# Patient Record
Sex: Female | Born: 1995 | Race: Black or African American | Hispanic: No | Marital: Single | Smoking: Never smoker
Health system: Southern US, Community
[De-identification: ages and names within clinical notes are randomized; demographics above are authoritative.]

## PROBLEM LIST (undated history)

## (undated) DIAGNOSIS — R5600 Simple febrile convulsions: Secondary | ICD-10-CM

## (undated) DIAGNOSIS — G35 Multiple sclerosis: Secondary | ICD-10-CM

## (undated) HISTORY — PX: LIPOSUCTION: SHX10

## (undated) HISTORY — PX: MYRINGOTOMY: SUR874

---
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
## 2017-05-18 ENCOUNTER — Other Ambulatory Visit: Payer: Self-pay | Admitting: Obstetrics and Gynecology

## 2017-11-23 ENCOUNTER — Other Ambulatory Visit: Payer: Self-pay

## 2017-11-23 ENCOUNTER — Encounter (HOSPITAL_COMMUNITY): Payer: Self-pay | Admitting: *Deleted

## 2017-11-23 ENCOUNTER — Ambulatory Visit (HOSPITAL_COMMUNITY)
Admission: EM | Admit: 2017-11-23 | Discharge: 2017-11-23 | Disposition: A | Discharge status: Home / Self Care | Payer: BLUE CROSS/BLUE SHIELD | Attending: Family Medicine | Admitting: Family Medicine

## 2017-11-23 ENCOUNTER — Ambulatory Visit (INDEPENDENT_AMBULATORY_CARE_PROVIDER_SITE_OTHER): Payer: BLUE CROSS/BLUE SHIELD

## 2017-11-23 DIAGNOSIS — T07XXXA Unspecified multiple injuries, initial encounter: Secondary | ICD-10-CM | POA: Diagnosis not present

## 2017-11-23 DIAGNOSIS — S9032XA Contusion of left foot, initial encounter: Secondary | ICD-10-CM | POA: Diagnosis not present

## 2017-11-23 DIAGNOSIS — G35 Multiple sclerosis: Secondary | ICD-10-CM

## 2017-11-23 DIAGNOSIS — G35D Multiple sclerosis, unspecified: Secondary | ICD-10-CM

## 2017-11-23 MED ORDER — IBUPROFEN 800 MG PO TABS: 800.0000 mg | ORAL_TABLET | Freq: Three times a day (TID) | ORAL | 0 refills | Status: DC
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
## 2017-11-23 NOTE — Discharge Instructions (Signed)
Use ice or heat to painful areas Take ibuprofen 3 x a day with food Limit walking activity while foot is painful Expect improvement over the next several days
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
## 2019-01-18 ENCOUNTER — Ambulatory Visit: Payer: BC Managed Care – PPO | Attending: Internal Medicine

## 2019-01-18 DIAGNOSIS — Z20822 Contact with and (suspected) exposure to covid-19: Secondary | ICD-10-CM

## 2019-01-19 LAB — NOVEL CORONAVIRUS, NAA: SARS-CoV-2, NAA: NOT DETECTED

## 2019-04-22 ENCOUNTER — Ambulatory Visit: Payer: BC Managed Care – PPO | Attending: Internal Medicine

## 2019-04-22 DIAGNOSIS — Z20822 Contact with and (suspected) exposure to covid-19: Secondary | ICD-10-CM

## 2019-04-23 LAB — NOVEL CORONAVIRUS, NAA: SARS-CoV-2, NAA: NOT DETECTED

## 2019-04-23 LAB — SARS-COV-2, NAA 2 DAY TAT

## 2019-09-23 ENCOUNTER — Other Ambulatory Visit: Payer: BC Managed Care – PPO

## 2019-09-23 ENCOUNTER — Other Ambulatory Visit: Payer: Self-pay | Admitting: Sleep Medicine

## 2019-09-23 DIAGNOSIS — I471 Supraventricular tachycardia: Secondary | ICD-10-CM

## 2019-09-24 LAB — NOVEL CORONAVIRUS, NAA: SARS-CoV-2, NAA: NOT DETECTED

## 2020-09-02 ENCOUNTER — Emergency Department (HOSPITAL_BASED_OUTPATIENT_CLINIC_OR_DEPARTMENT_OTHER)
Admission: EM | Admit: 2020-09-02 | Discharge: 2020-09-02 | Disposition: A | Discharge status: Home / Self Care | Payer: BC Managed Care – PPO | Attending: Emergency Medicine | Admitting: Emergency Medicine

## 2020-09-02 ENCOUNTER — Encounter (HOSPITAL_BASED_OUTPATIENT_CLINIC_OR_DEPARTMENT_OTHER): Payer: Self-pay

## 2020-09-02 ENCOUNTER — Other Ambulatory Visit: Payer: Self-pay

## 2020-09-02 DIAGNOSIS — H66002 Acute suppurative otitis media without spontaneous rupture of ear drum, left ear: Secondary | ICD-10-CM | POA: Diagnosis not present

## 2020-09-02 DIAGNOSIS — H9202 Otalgia, left ear: Secondary | ICD-10-CM | POA: Diagnosis present

## 2020-09-02 MED ORDER — AMOXICILLIN 500 MG PO CAPS: 500.0000 mg | ORAL_CAPSULE | Freq: Three times a day (TID) | ORAL | 0 refills | Status: DC

## 2020-09-02 MED ORDER — AMOXICILLIN 500 MG PO CAPS
500.0000 mg | ORAL_CAPSULE | Freq: Once | ORAL | Status: AC
  Administered 2020-09-02: 500 mg via ORAL
  Filled 2020-09-02: qty 1

## 2020-09-02 NOTE — ED Provider Notes (Signed)
[facility] EMERGENCY DEPARTMENT Provider Note   CSN: [id number] Arrival date & time: 09/02/20  1512     History Chief Complaint  Patient presents with   Otalgia    Monica Tate is a 25 y.o. female presenting for valuation of left ear drainage and pain.  Patient states that the past 4 days she has had drainage and pain of her left ear.  This started after her ears were cleaned at her primary care doctor's office.  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  She denies fevers or chills.  No significant nasal congestion.  No pain on the right side.  She was not having any issues prior to the PCP appointment. She has not taken anything for her sxs.   HPI     Past Medical History:  Diagnosis Date   Febrile seizures (HCC)    MS (multiple sclerosis) (HCC)     Patient Active Problem List   Diagnosis Date Noted   MS (multiple sclerosis) (HCC) 11/23/2017   Optic neuritis, left 01/21/2012    Past Surgical History:  Procedure Laterality Date   MYRINGOTOMY       OB History   No obstetric history on file.     Family History  Problem Relation Age of Onset   Crohn's disease Mother    Hypertension Father    Lupus Father    Diabetes Maternal Grandmother    Hypertension Maternal Grandmother    Scleroderma Maternal Grandmother    Hypertension Maternal Grandfather    Diabetes Paternal Grandmother    Hypertension Paternal Grandmother    Heart disease Paternal Grandmother    Hypertension Paternal Grandfather    Heart disease Paternal Grandfather     Social History   Tobacco Use   Smoking status: Never   Smokeless tobacco: Never  Vaping Use   Vaping Use: Never used  Substance Use Topics   Alcohol use: Yes   Drug use: No    Home Medications Prior to Admission medications   Medication Sig Start Date End Date Taking? Authorizing Provider  amoxicillin (AMOXIL) 500 MG capsule Take 1 capsule (500 mg total) by mouth 3 (three)  times daily. 09/02/20  Yes Ankit Degregorio, PA-C  Fingolimod HCl (GILENYA) 0.5 MG CAPS Take 0.5 mg by mouth daily.    [provider]  ibuprofen (ADVIL,MOTRIN) 800 MG tablet Take 1 tablet (800 mg total) by mouth 3 (three) times daily. 11/23/17   Eustace Moore, MD    Allergies    Patient has no known allergies.  Review of Systems   Review of Systems  Constitutional:  Negative for fever.  HENT:  Positive for ear pain.    Physical Exam Updated Vital Signs BP 126/80 (BP Location: Right Arm)   Pulse 86   Temp 97.9 F (36.6 C) (Oral)   Resp 18   Ht 5\' 7"  (1.702 m)   Wt 112.9 kg   LMP 09/02/2020   SpO2 100%   BMI 39.00 kg/m   Physical Exam Vitals and nursing note reviewed.  Constitutional:      General: She is not in acute distress.    Appearance: She is well-developed.  HENT:     Head: Normocephalic and atraumatic.     Ears:     Comments: L TM not completely visualized due to drainage in the ear canal.  Mild purulence noted and the right lower part of the eardrum is erythematous. Right TM nonerythematous nonbulging  Eyes:     Extraocular Movements: Extraocular movements intact.  Cardiovascular:     Rate and Rhythm: Normal rate.  Pulmonary:     Effort: Pulmonary effort is normal.  Abdominal:     General: There is no distension.  Musculoskeletal:        General: Normal range of motion.     Cervical back: Normal range of motion.  Skin:    General: Skin is warm.     Findings: No rash.  Neurological:     Mental Status: She is alert and oriented to person, place, and time.     ED Results / Procedures / Treatments   Labs (all labs ordered are listed, but only abnormal results are displayed) Labs Reviewed - No data to display  EKG None  Radiology No results found.  Procedures Procedures   Medications Ordered in ED Medications  amoxicillin (AMOXIL) capsule 500 mg (500 mg Oral Given 09/02/20 1842)    ED Course  I have reviewed the triage vital  signs and the nursing notes.  Pertinent labs & imaging results that were available during my care of the patient were reviewed by me and considered in my medical decision making (see chart for details).    MDM Rules/Calculators/A&P                           Patient presenting for evaluation of left ear pain and drainage.  On exam, patient appears nontoxic.  TM is not completely visualized, but the part that is seen is erythematous.  There is some drainage/discharge.  As such, will treat for AOM.  At this time, patient appears safe for discharge.  Return precautions given.  Patient states she understands and agrees to plan  Final Clinical Impression(s) / ED Diagnoses Final diagnoses:  Acute suppurative otitis media of left ear without spontaneous rupture of tympanic membrane, recurrence not specified    Rx / DC Orders ED Discharge Orders          Ordered    amoxicillin (AMOXIL) 500 MG capsule  3 times daily        09/02/20 1807             Alveria Apley, PA-C 09/02/20 1845    Charlynne Pander, MD 09/02/20 1858

## 2020-09-02 NOTE — ED Triage Notes (Signed)
Pt arrives ambulatory to ED with c/o having water in ear drum , states that she has a hole in her ear drum and had her ears cleaned during a physical states pain and drainage since.

## 2020-09-02 NOTE — ED Notes (Signed)
Pt discharged to home. Discharge instructions have been discussed with patient and/or family members. Pt verbally acknowledges understanding d/c instructions, and endorses comprehension to checkout at registration before leaving.  °

## 2020-09-02 NOTE — Discharge Instructions (Addendum)
Take antibiotics as prescribed.  Take entire course, even if symptoms improve. Use Tylenol ibuprofen as needed for pain. You may want to try Flonase to help with nasal congestion and drainage. Follow-up with your primary care doctor if symptoms are not improving. Return to the emergency room with any new, worsening, concerning symptoms

## 2021-01-20 NOTE — L&D Delivery Note (Signed)
Delivery Note    Patient Name: Monica Tate DOB: 03-04-1995 MRN: [id number]  Date of admission: 01/13/2022 Delivering MD: Noralyn Pick  Date of delivery: 01/14/2022 Type of delivery: SVD  Newborn Data: Live born female  Birth Weight:   APGAR: 79, 79  Newborn Delivery   Birth date/time: 01/14/2022 01:04:33 Delivery type: Vaginal, Spontaneous      Monica Tate, 26 y.o., @ [redacted]w[redacted]d  G1P0, who was admitted for admitted on 12/25 for spontaneous latent labor progressed with AROM , clear, gbs-, rh- (O-). I was called to the room when she progressed 2+ station in the second stage of labor.  She pushed for 15/min.  She delivered a viable infant, cephalic and restituted to the LOA position over an intact perineum.  A nuchal cord   was not identified. The baby was placed on maternal abdomen while initial step of NRP were perfmored (Dry, Stimulated, and warmed). Hat placed on baby for thermoregulation. Delayed cord clamping was performed for 8 minutes.  Cord double clamped and cut.  Cord cut by FOB. Apgar scores were 9 and 9. Prophylactic Pitocin was started in the third stage of labor for active management. The placenta delivered spontaneously, shultz, with a 3 vessel cord and was left in the room for pt pt but if pt does not get ice bucket will sent to LD.  Inspection revealed right and left shallow labial tear/abrasions, no repair indicated. An examination of the vaginal vault and cervix was free from lacerations. The uterus was firm, bleeding stable.  Placenta and umbilical artery blood gas were not sent.  There were no complications during the procedure, mother desires to take placenta home, pt informed she may do that but will need a ice bucket to keep it on to travel home with, pt desires to encapsulated the placenta, pt informed that I was under the impression she would need to have that kit or organized prior to delivery for safe and fast encapsulation, but pt will klook into this.  Mom and  baby skin to skin following delivery. Left in stable condition.  Maternal Info: Anesthesia: Epidural Episiotomy: no Lacerations:  shallow right and left labials Suture Repair: no Est. Blood Loss (mL):  139  Newborn Info:  Baby Sex: female Babies Name: [facility] APGAR (1 MIN): 9   APGAR (5 MINS): 9   APGAR (10 MINS):     Mom to postpartum.  Baby to Couplet care / Skin to Skin.  Delivery Report:    Review the Delivery Report for details.   JPlankinton FNP-C, PMHNP-BC  [id number] [id number] Cell: [phone] Office Phone: [phone]: [phone]/26/2023  1:33 AM

## 2021-05-13 ENCOUNTER — Emergency Department (HOSPITAL_BASED_OUTPATIENT_CLINIC_OR_DEPARTMENT_OTHER)
Admission: EM | Admit: 2021-05-13 | Discharge: 2021-05-14 | Disposition: A | Discharge status: Home / Self Care | Payer: BC Managed Care – PPO | Attending: Emergency Medicine | Admitting: Emergency Medicine

## 2021-05-13 ENCOUNTER — Emergency Department (HOSPITAL_BASED_OUTPATIENT_CLINIC_OR_DEPARTMENT_OTHER): Payer: BC Managed Care – PPO

## 2021-05-13 ENCOUNTER — Encounter (HOSPITAL_BASED_OUTPATIENT_CLINIC_OR_DEPARTMENT_OTHER): Payer: Self-pay | Admitting: Emergency Medicine

## 2021-05-13 ENCOUNTER — Other Ambulatory Visit: Payer: Self-pay

## 2021-05-13 DIAGNOSIS — R791 Abnormal coagulation profile: Secondary | ICD-10-CM | POA: Diagnosis not present

## 2021-05-13 DIAGNOSIS — R600 Localized edema: Secondary | ICD-10-CM | POA: Insufficient documentation

## 2021-05-13 DIAGNOSIS — M7989 Other specified soft tissue disorders: Secondary | ICD-10-CM | POA: Diagnosis present

## 2021-05-13 LAB — COMPREHENSIVE METABOLIC PANEL
ALT: 41 U/L (ref 0–44)
AST: 31 U/L (ref 15–41)
Albumin: 4.2 g/dL (ref 3.5–5.0)
Alkaline Phosphatase: 67 U/L (ref 38–126)
Anion gap: 7 (ref 5–15)
BUN: 6 mg/dL (ref 6–20)
CO2: 23 mmol/L (ref 22–32)
Calcium: 8.8 mg/dL — ABNORMAL LOW (ref 8.9–10.3)
Chloride: 108 mmol/L (ref 98–111)
Creatinine, Ser: 0.79 mg/dL (ref 0.44–1.00)
GFR, Estimated: >60 mL/min (ref >60)
Glucose, Bld: 92 mg/dL (ref 70–99)
Potassium: 3.5 mmol/L (ref 3.5–5.1)
Sodium: 138 mmol/L (ref 135–145)
Total Bilirubin: 0.1 mg/dL — ABNORMAL LOW (ref 0.3–1.2)
Total Protein: 7.9 g/dL (ref 6.5–8.1)

## 2021-05-13 LAB — CBC WITH DIFFERENTIAL/PLATELET
Abs Immature Granulocytes: 0.01 10*3/uL (ref 0.00–0.07)
Basophils Absolute: 0 10*3/uL (ref 0.0–0.1)
Basophils Relative: 0 %
Eosinophils Absolute: 0 10*3/uL (ref 0.0–0.5)
Eosinophils Relative: 1 %
HCT: 37.6 % (ref 36.0–46.0)
Hemoglobin: 12.5 g/dL (ref 12.0–15.0)
Immature Granulocytes: 0 %
Lymphocytes Relative: 13 %
Lymphs Abs: 0.6 10*3/uL — ABNORMAL LOW (ref 0.7–4.0)
MCH: 29.3 pg (ref 26.0–34.0)
MCHC: 33.2 g/dL (ref 30.0–36.0)
MCV: 88.1 fL (ref 80.0–100.0)
Monocytes Absolute: 0.7 10*3/uL (ref 0.1–1.0)
Monocytes Relative: 15 %
Neutro Abs: 3.2 10*3/uL (ref 1.7–7.7)
Neutrophils Relative %: 71 %
Platelets: 299 10*3/uL (ref 150–400)
RBC: 4.27 MIL/uL (ref 3.87–5.11)
RDW: 13.1 % (ref 11.5–15.5)
WBC: 4.4 10*3/uL (ref 4.0–10.5)
nRBC: 0 % (ref 0.0–0.2)

## 2021-05-13 LAB — D-DIMER, QUANTITATIVE: D-Dimer, Quant: 1.43 ug/mL-FEU — ABNORMAL HIGH (ref 0.00–0.50)

## 2021-05-13 NOTE — ED Triage Notes (Signed)
Pt c/o lower BL leg pain and swelling x 1 month. 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 Pt is on birth control.  ?

## 2021-05-13 NOTE — ED Provider Notes (Signed)
?[facility] EMERGENCY DEPARTMENT ?Provider Note ? ? ?CSN: [id number] ?Arrival date & time: 05/13/21  2150 ? ?  ? ?History ? ?Chief Complaint  ?Patient presents with  ? Leg Pain  ? Leg Swelling  ? ? ?Monica Tate is a 26 y.o. female. ? ?26 y.o female with a PMH of MS presents to the ED with a chief complaint of bilateral leg swelling x a few months. This is a recurrent complaint, she reports noticing this after surgery in September and had a negative DVT study but did not follow up with anyone about the swelling. This has gotten worse with pain and also swelling.  Patient does report worsening leg pain, feels that this is exacerbated after a workday.  Patient is currently on birth control, has a diva cup and prior to this had the next plan on.  Does not follow-up with her PCP, however does report compliance with her medication for MS.  Not have any prior history of blood clots, no shortness of breath, no chest pain.  ? ?The history is provided by the patient.  ?Leg Pain ?Location:  Leg ?Time since incident:  3 months ?Injury: no   ?Associated symptoms: no back pain and no fever   ? ?  ? ?Home Medications ?Prior to Admission medications   ?Medication Sig Start Date End Date Taking? Authorizing Provider  ?cephALEXin (KEFLEX) 500 MG capsule Take 1 capsule (500 mg total) by mouth 3 (three) times daily. 05/19/21   Achille Rich, PA-C  ?Fingolimod HCl (GILENYA) 0.5 MG CAPS Take 0.5 mg by mouth daily.    [provider]  ?ibuprofen (ADVIL,MOTRIN) 800 MG tablet Take 1 tablet (800 mg total) by mouth 3 (three) times daily. 11/23/17   Eustace Moore, MD  ?   ? ?Allergies    ?Patient has no known allergies.   ? ?Review of Systems   ?Review of Systems  ?Constitutional:  Negative for chills and fever.  ?Respiratory:  Negative for shortness of breath.   ?Cardiovascular:  Positive for leg swelling (bilteral). Negative for chest pain.  ?Gastrointestinal:  Negative for abdominal pain, nausea and vomiting.   ?Genitourinary:  Negative for flank pain.  ?Musculoskeletal:  Negative for back pain.  ?All other systems reviewed and are negative. ? ?Physical Exam ?Updated Vital Signs ?BP 128/72 (BP Location: Right Arm)   Pulse 88   Temp 97.9 ?F (36.6 ?C) (Oral)   Resp 18   Ht 5\' 7"  (1.702 m)   Wt 113.4 kg   LMP 04/12/2021 (Approximate)   SpO2 100%   BMI 39.16 kg/m?  ?Physical Exam ?Vitals and nursing note reviewed.  ?Constitutional:   ?   Appearance: Normal appearance.  ?HENT:  ?   Head: Normocephalic and atraumatic.  ?   Mouth/Throat:  ?   Mouth: Mucous membranes are moist.  ?Cardiovascular:  ?   Pulses:     ?     Dorsalis pedis pulses are 2+ on the right side and 2+ on the left side.  ?   Comments: No bilateral pitting edema, no calf tenderness bilaterally.  DP, PT pulses present. ?Pulmonary:  ?   Effort: Pulmonary effort is normal.  ?   Breath sounds: No wheezing.  ?Abdominal:  ?   General: Abdomen is flat.  ?   Tenderness: There is no abdominal tenderness.  ?Musculoskeletal:     ?   General: Swelling present.  ?   Cervical back: Normal range of motion and neck supple.  ?Skin: ?  General: Skin is warm and dry.  ?Neurological:  ?   Mental Status: She is alert and oriented to person, place, and time.  ? ? ?ED Results / Procedures / Treatments   ?Labs ?(all labs ordered are listed, but only abnormal results are displayed) ?Labs Reviewed  ?CBC WITH DIFFERENTIAL/PLATELET - Abnormal; Notable for the following components:  ?    Result Value  ? Lymphs Abs 0.6 (*)   ? All other components within normal limits  ?COMPREHENSIVE METABOLIC PANEL - Abnormal; Notable for the following components:  ? Calcium 8.8 (*)   ? Total Bilirubin 0.1 (*)   ? All other components within normal limits  ?D-DIMER, QUANTITATIVE - Abnormal; Notable for the following components:  ? D-Dimer, Quant 1.43 (*)   ? All other components within normal limits  ? ? ?EKG ?None ? ?Radiology ?US OB LESS THAN 14 WEEKS WITH OB TRANSVAGINAL ? ?Result Date:  05/19/2021 ?CLINICAL DATA:  Pelvic pain. EXAM: OBSTETRIC <14 WK [place] AND TRANSVAGINAL OB US TECHNIQUE: Both transabdominal and transvaginal ultrasound examinations were performed for complete evaluation of the gestation as well as the maternal uterus, adnexal regions, and pelvic cul-de-sac. Transvaginal technique was performed to assess early pregnancy. COMPARISON:  None. FINDINGS: Intrauterine gestational sac: Single Yolk sac:  Visualized. Embryo:  Visualized. Cardiac Activity: Not Visualized. CRL:  2 mm   5 w   5 d                  [place] EDC: 01/14/2022 Subchorionic hemorrhage:  None visualized. Maternal uterus/adnexae: Corpus luteum noted in the right ovary. Left ovary is within normal limits. Trace free fluid in the pelvis. IMPRESSION: 1. Single intrauterine gestation identified. No cardiac activity identified at this time which may still be within normal limits for a gestation this age. Recommend short-term follow-up ultrasound confirm viability. 2. Likely corpus luteum in the right ovary. 3. Trace free fluid in the pelvis. Electronically Signed   By: Darliss Cheney M.D.   On: 05/19/2021 19:55   ? ?Procedures ?Procedures  ? ? ?Medications Ordered in ED ?Medications - No data to display ? ?ED Course/ Medical Decision Making/ A&P ?  ?                        ?Medical Decision Making ?Amount and/or Complexity of Data Reviewed ?Labs: ordered. ? ? ?This patient presents to the ED for concern of leg swelling BL, this involves a number of treatment options, and is a complaint that carries with it a high risk of complications and morbidity.  The differential diagnosis includes DVT, cellulitis, CHF versus peripheral edema ? ? ?Co morbidities: ?Discussed in HPI ? ? ?Brief History: ? ?Patient with a prior history of MS currently on medication presents to the ED with bilateral leg swelling for the past several months.  Reports worsening pain, worsening swelling to her bilateral legs, no trauma, no weakness, no fevers, no prior  history of blood clots.  She is on OCPs, with a diva cup in place, primarily she had a Nexplanon but no systemic OCPs. ? ?EMR reviewed including pt PMHx, past surgical history and past visits to ER.  ? ?See HPI for more details ? ? ?Lab Tests: ? ?I ordered and independently interpreted labs.  The pertinent results include:   ? ?I personally reviewed all laboratory work and imaging. Metabolic panel without any acute abnormality specifically kidney function within normal limits and no significant electrolyte abnormalities. CBC without leukocytosis  or significant anemia. Dimer level obtained which was positive.  ? ? ?Imaging Studies: ? ?US DVT pending.  ? ?Social Determinants of Health: ? ?The patient's social determinants of health were a factor in the care of this patient ? ? ? ?Problem List / ED Course: ? ?Patient presented for positive D-dimer level, requesting DVT study due to ongoing bilateral leg pain.  No chest pain, no shortness of breath.  Patient signed out to incoming team pending ultrasound DVT study. ? ? ?Dispostion: ? ?Pending DVT study, stable for further management.  ? ? ? ?Portions of this note were generated with Scientist, clinical (histocompatibility and immunogenetics). Dictation errors may occur despite best attempts at proofreading.   ?Final Clinical Impression(s) / ED Diagnoses ?Final diagnoses:  ?Bilateral lower extremity edema  ? ? ?Rx / DC Orders ?ED Discharge Orders   ? ? None  ? ?  ? ? ?  ?Claude Manges, PA-C ?05/21/21 1140 ? ?  ?Melene Plan, DO ?05/21/21 1501 ? ?

## 2021-05-13 NOTE — ED Notes (Signed)
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

## 2021-05-14 NOTE — ED Provider Notes (Signed)
I assumed care of this patient.  Please see previous provider note for further details of Hx, PE.  Briefly patient is a 26 y.o. female who presented BLE swelling. Dimer +, pending [place]. ? ?[place] negative for DVT. ?Recommended compression stockings, elevation, and exercise. ? ?PCP follow up. ? ?The patient appears reasonably screened and/or stabilized for discharge and I doubt any other medical condition or other [facility] requiring further screening, evaluation, or treatment in the ED at this time prior to discharge. Safe for discharge with strict return precautions. ? ?Disposition: Discharge ? ?Condition: Good ? ?I have discussed the results, Dx and Tx plan with the patient/family who expressed understanding and agree(s) with the plan. Discharge instructions discussed at length. The patient/family was given strict return precautions who verbalized understanding of the instructions. No further questions at time of discharge.  ? ? ?ED Discharge Orders   ? ? None  ? ?  ? ? ? ?Follow Up: ?Virgilio Belling, PA-C ?[address] ?[place] [address] ?[phone] ? ?Call  ?to schedule an appointment for close follow up ? ? ?  ? ? ? ?  ?Nira Conn, MD ?05/14/21 0110 ? ?

## 2021-05-17 ENCOUNTER — Emergency Department (HOSPITAL_BASED_OUTPATIENT_CLINIC_OR_DEPARTMENT_OTHER): Payer: BC Managed Care – PPO

## 2021-05-17 ENCOUNTER — Other Ambulatory Visit: Payer: Self-pay

## 2021-05-17 ENCOUNTER — Encounter (HOSPITAL_BASED_OUTPATIENT_CLINIC_OR_DEPARTMENT_OTHER): Payer: Self-pay

## 2021-05-17 ENCOUNTER — Emergency Department (HOSPITAL_BASED_OUTPATIENT_CLINIC_OR_DEPARTMENT_OTHER)
Admission: EM | Admit: 2021-05-17 | Discharge: 2021-05-17 | Disposition: A | Discharge status: Home / Self Care | Payer: BC Managed Care – PPO | Attending: Emergency Medicine | Admitting: Emergency Medicine

## 2021-05-17 DIAGNOSIS — R0602 Shortness of breath: Secondary | ICD-10-CM | POA: Insufficient documentation

## 2021-05-17 DIAGNOSIS — M7989 Other specified soft tissue disorders: Secondary | ICD-10-CM | POA: Insufficient documentation

## 2021-05-17 DIAGNOSIS — R7989 Other specified abnormal findings of blood chemistry: Secondary | ICD-10-CM | POA: Diagnosis not present

## 2021-05-17 LAB — BASIC METABOLIC PANEL
Anion gap: 6 (ref 5–15)
BUN: 10 mg/dL (ref 6–20)
CO2: 24 mmol/L (ref 22–32)
Calcium: 9 mg/dL (ref 8.9–10.3)
Chloride: 107 mmol/L (ref 98–111)
Creatinine, Ser: 0.91 mg/dL (ref 0.44–1.00)
GFR, Estimated: >60 mL/min (ref >60)
Glucose, Bld: 80 mg/dL (ref 70–99)
Potassium: 4.3 mmol/L (ref 3.5–5.1)
Sodium: 137 mmol/L (ref 135–145)

## 2021-05-17 MED ORDER — IOHEXOL 350 MG/ML SOLN
100.0000 mL | Freq: Once | INTRAVENOUS | Status: AC | PRN
Start: 2021-05-17 — End: 2021-05-17
  Administered 2021-05-17: 75 mL via INTRAVENOUS

## 2021-05-17 NOTE — ED Notes (Signed)
Attempted IV access x1. Obtained blood return in left AC, when flushing, patient c/o soreness at site and infiltration noted. Catheter removed. Boneta Lucks, RT at bedside to assist with bedside US to obtain IV access.  ?

## 2021-05-17 NOTE — ED Notes (Signed)
Patient states she has had intermittent swelling to bilateral lower legs x2 months. States this past week the swelling was persistent and she had pain in bilateral lower legs and sore on palpation. Patient was seen and had [place] of bilateral lower legs. Patient also reports intermittent shortness of breath. Patient was seen by her PCP on Thursday. Reports she was called today and told that her D-dimer remained high and she needed to go get a CT of her chest. Patient states she went to outpatient CT and they were unable to obtain IV access and she was told to go to an ER.  ?

## 2021-05-17 NOTE — Discharge Instructions (Addendum)
You were seen in the emergency department today for shortness of breath and leg swelling. ? ?As we discussed the scan of your chest showed no evidence of blood clots.  I am unsure as to why exactly your D-dimer level was elevated.  I recommend following up with your primary doctor next week to have this lab rechecked. ? ?There is not a significant amount of data about d-dimer levels and multiple sclerosis, but I think it would be worth it to follow up with your neurologist.  ? ?Continue to monitor how you're doing and return to the ER for new or worsening symptoms.  ?

## 2021-05-17 NOTE — ED Triage Notes (Signed)
Patient states her d-dimer was elevated and she was instructed by her provider to come to the ED for a CTA. Patient states shortness of breath is intermittent but has gotten progressively worse.  ?

## 2021-05-17 NOTE — ED Notes (Signed)
Boneta Lucks, RT remains at bedside to attempt IV access via [place].  ?

## 2021-05-17 NOTE — ED Notes (Signed)
ED Provider at bedside to obtain IV access. ?

## 2021-05-18 NOTE — ED Provider Notes (Signed)
?[facility] EMERGENCY DEPARTMENT ?Provider Note ? ? ?CSN: [id number] ?Arrival date & time: 05/17/21  1840 ? ?  ? ?History ? ?Chief Complaint  ?Patient presents with  ? Shortness of Breath  ? Leg Swelling  ? ? ?Monica Tate is a 26 y.o. female who presents the emergency department complaining of bilateral lower leg swelling for the past 2 months patient states that the symptoms were intermittent, but recently have been more persistent and painful.  She was seen at an outside facility and had ultrasounds of bilateral lower legs.  She started to develop some shortness of breath.  She was seen by her primary doctor on Thursday, and then called today and told her D-dimer level was elevated.  She was sent to get an outpatient CT scan to rule out a PE, and they were unable to obtain IV access.  They encouraged her to go to the emergency department. ? ? ?Shortness of Breath ?Associated symptoms: no abdominal pain, no chest pain and no cough   ? ?  ? ?Home Medications ?Prior to Admission medications   ?Medication Sig Start Date End Date Taking? Authorizing Provider  ?amoxicillin (AMOXIL) 500 MG capsule Take 1 capsule (500 mg total) by mouth 3 (three) times daily. 09/02/20   Caccavale, Sophia, PA-C  ?Fingolimod HCl (GILENYA) 0.5 MG CAPS Take 0.5 mg by mouth daily.    [provider]  ?ibuprofen (ADVIL,MOTRIN) 800 MG tablet Take 1 tablet (800 mg total) by mouth 3 (three) times daily. 11/23/17   Eustace Moore, MD  ?   ? ?Allergies    ?Patient has no known allergies.   ? ?Review of Systems   ?Review of Systems  ?Respiratory:  Positive for shortness of breath. Negative for cough and chest tightness.   ?Cardiovascular:  Positive for leg swelling. Negative for chest pain and palpitations.  ?Gastrointestinal:  Negative for abdominal pain.  ?Neurological:  Negative for syncope.  ?All other systems reviewed and are negative. ? ?Physical Exam ?Updated Vital Signs ?BP 114/78 (BP Location: Left Arm)   Pulse 73    Temp 98.2 ?F (36.8 ?C) (Oral)   Resp 16   Ht 5\' 7"  (1.702 m)   Wt 113.4 kg   LMP 05/13/2021   SpO2 100%   BMI 39.16 kg/m?  ?Physical Exam ?Vitals and nursing note reviewed.  ?Constitutional:   ?   Appearance: Normal appearance.  ?HENT:  ?   Head: Normocephalic and atraumatic.  ?Eyes:  ?   Conjunctiva/sclera: Conjunctivae normal.  ?Cardiovascular:  ?   Rate and Rhythm: Normal rate and regular rhythm.  ?Pulmonary:  ?   Effort: Pulmonary effort is normal. No respiratory distress.  ?   Breath sounds: Normal breath sounds.  ?Abdominal:  ?   General: There is no distension.  ?   Palpations: Abdomen is soft.  ?   Tenderness: There is no abdominal tenderness.  ?Musculoskeletal:  ?   Right lower leg: No edema.  ?   Left lower leg: No edema.  ?Skin: ?   General: Skin is warm and dry.  ?Neurological:  ?   General: No focal deficit present.  ?   Mental Status: She is alert.  ? ? ?ED Results / Procedures / Treatments   ?Labs ?(all labs ordered are listed, but only abnormal results are displayed) ?Labs Reviewed  ?BASIC METABOLIC PANEL  ? ? ?EKG ?None ? ?Radiology ?CT Angio Chest PE W and/or Wo Contrast ? ?Result Date: 05/17/2021 ?CLINICAL DATA:  Pulmonary  embolism (PE) suspected, positive D-dimer Intermittent but progressive shortness of breath. EXAM: CT ANGIOGRAPHY CHEST WITH CONTRAST TECHNIQUE: Multidetector CT imaging of the chest was performed using the standard protocol during bolus administration of intravenous contrast. Multiplanar CT image reconstructions and MIPs were obtained to evaluate the vascular anatomy. RADIATION DOSE REDUCTION: This exam was performed according to the departmental dose-optimization program which includes automated exposure control, adjustment of the mA and/or kV according to patient size and/or use of iterative reconstruction technique. CONTRAST:  41mL OMNIPAQUE IOHEXOL 350 MG/ML SOLN COMPARISON:  None. FINDINGS: Cardiovascular: There are no filling defects within the pulmonary arteries to  suggest pulmonary embolus. The subsegmental branches are not well assessed due to contrast bolus timing. Thoracic aorta is normal in caliber. No dissection or acute aortic findings conventional branching pattern from the aortic arch. Heart is normal in size without pericardial effusion. Mediastinum/Nodes: No adenopathy or mediastinal mass. Decompressed esophagus. No thyroid nodule. Lungs/Pleura: Clear lungs. No focal airspace disease. No pleural fluid. No pulmonary nodule or mass. Upper Abdomen: No acute or unexpected findings. Musculoskeletal: There are no acute or suspicious osseous abnormalities. Review of the MIP images confirms the above findings. IMPRESSION: No pulmonary embolus or acute intrathoracic abnormality. Electronically Signed   By: Narda Rutherford M.D.   On: 05/17/2021 22:08   ? ?Procedures ?Procedures  ? ? ?Medications Ordered in ED ?Medications  ?iohexol (OMNIPAQUE) 350 MG/ML injection 100 mL (75 mLs Intravenous Contrast Given 05/17/21 2143)  ? ? ?ED Course/ Medical Decision Making/ A&P ?  ?                        ?Medical Decision Making ?Amount and/or Complexity of Data Reviewed ?Labs: ordered. ?Radiology: ordered. ? ?Risk ?Prescription drug management. ? ?This patient presents to the ED for concern of shortness of breath, this involves an extensive number of treatment options, and is a complaint that carries with it a high risk of complications and morbidity. The emergent differential diagnosis prior to evaluation includes, but is not limited to,  Cardiac (heart failure, pericardial effusion/tamponade, arrhythmias, ischemia), Respiratory (COPD, asthma, bronchitis, pneumonia, pneumothorax, pulmonary HTN, pulmonary embolism), Hematologic (anemia), Neuromuscular (ALS, Guillain-Barre, MS). This is not an exhaustive differential.  ? ?Past Medical History / Co-morbidities / Social History: ?Multiple sclerosis ? ?Additional history: ?Chart reviewed. Pertinent results include: Labs performed at outside  facility on 4/24 and 4/27 showed relatively normal hemoglobin. Electrolytes within normal limits.  D-dimer on the 24th 1.43, repeat on the 27th 1690.  ? ?Physical Exam: ?Physical exam performed. The pertinent findings include: Normal vital signs.  No obvious leg swelling.  Lung sounds clear. ? ?Lab Tests: ?I ordered, and personally interpreted labs.  The pertinent results include: Normal name.  Normal electrolytes. ?  ?Imaging Studies: ?I ordered imaging studies including CT angio chest. I independently visualized and interpreted imaging which showed no pulmonary embolism. I agree with the radiologist interpretation. ?  ?Disposition: ?After consideration of the diagnostic results and the patients response to treatment, I feel that is not requiring mission or inpatient treatment.  Discussed the imaging results with the patient.  At this time, we did not find an acute etiology for patient's elevated D-dimer today.  I recommended follow-up with her primary doctor.  Patient inquired about following up with her neurologist, with concerns that maybe her multiple sclerosis is affecting her D-dimer.  We discussed reasons to return the emergency department, patient is agreeable to the plan. ?  ?Final Clinical Impression(s) /  ED Diagnoses ?Final diagnoses:  ?Shortness of breath  ?Positive D dimer  ? ? ?Rx / DC Orders ?ED Discharge Orders   ? ? None  ? ?  ? ?Portions of this report may have been transcribed using voice recognition software. Every effort was made to ensure accuracy; however, inadvertent computerized transcription errors may be present. ? ?  ?Ishmael Berkovich T, PA-C ?05/18/21 0006 ? ?  ?Franne Forts, DO ?05/19/21 1514 ? ?

## 2021-05-19 ENCOUNTER — Other Ambulatory Visit: Payer: Self-pay

## 2021-05-19 ENCOUNTER — Encounter (HOSPITAL_BASED_OUTPATIENT_CLINIC_OR_DEPARTMENT_OTHER): Payer: Self-pay | Admitting: Emergency Medicine

## 2021-05-19 ENCOUNTER — Emergency Department (HOSPITAL_BASED_OUTPATIENT_CLINIC_OR_DEPARTMENT_OTHER): Payer: BC Managed Care – PPO

## 2021-05-19 ENCOUNTER — Emergency Department (HOSPITAL_BASED_OUTPATIENT_CLINIC_OR_DEPARTMENT_OTHER)
Admission: EM | Admit: 2021-05-19 | Discharge: 2021-05-19 | Disposition: A | Discharge status: Home / Self Care | Payer: BC Managed Care – PPO | Attending: Emergency Medicine | Admitting: Emergency Medicine

## 2021-05-19 DIAGNOSIS — N9489 Other specified conditions associated with female genital organs and menstrual cycle: Secondary | ICD-10-CM | POA: Diagnosis not present

## 2021-05-19 DIAGNOSIS — N3000 Acute cystitis without hematuria: Secondary | ICD-10-CM | POA: Insufficient documentation

## 2021-05-19 DIAGNOSIS — Z3201 Encounter for pregnancy test, result positive: Secondary | ICD-10-CM | POA: Diagnosis not present

## 2021-05-19 DIAGNOSIS — D649 Anemia, unspecified: Secondary | ICD-10-CM | POA: Insufficient documentation

## 2021-05-19 DIAGNOSIS — R300 Dysuria: Secondary | ICD-10-CM | POA: Diagnosis present

## 2021-05-19 LAB — URINALYSIS, ROUTINE W REFLEX MICROSCOPIC
Glucose, UA: 100 mg/dL — AB
Ketones, ur: 15 mg/dL — AB
Nitrite: POSITIVE — AB
Protein, ur: 100 mg/dL — AB
Specific Gravity, Urine: >=1.03 (ref 1.005–1.030)
pH: 5 (ref 5.0–8.0)

## 2021-05-19 LAB — URINALYSIS, MICROSCOPIC (REFLEX): WBC, UA: >50 WBC/hpf (ref 0–5)

## 2021-05-19 LAB — CBC WITH DIFFERENTIAL/PLATELET
Abs Immature Granulocytes: 0.02 10*3/uL (ref 0.00–0.07)
Basophils Absolute: 0 10*3/uL (ref 0.0–0.1)
Basophils Relative: 0 %
Eosinophils Absolute: 0 10*3/uL (ref 0.0–0.5)
Eosinophils Relative: 0 %
HCT: 34.7 % — ABNORMAL LOW (ref 36.0–46.0)
Hemoglobin: 11.5 g/dL — ABNORMAL LOW (ref 12.0–15.0)
Immature Granulocytes: 0 %
Lymphocytes Relative: 6 %
Lymphs Abs: 0.3 10*3/uL — ABNORMAL LOW (ref 0.7–4.0)
MCH: 29.2 pg (ref 26.0–34.0)
MCHC: 33.1 g/dL (ref 30.0–36.0)
MCV: 88.1 fL (ref 80.0–100.0)
Monocytes Absolute: 0.6 10*3/uL (ref 0.1–1.0)
Monocytes Relative: 11 %
Neutro Abs: 4.5 10*3/uL (ref 1.7–7.7)
Neutrophils Relative %: 83 %
Platelets: 250 10*3/uL (ref 150–400)
RBC: 3.94 MIL/uL (ref 3.87–5.11)
RDW: 12.8 % (ref 11.5–15.5)
WBC: 5.4 10*3/uL (ref 4.0–10.5)
nRBC: 0 % (ref 0.0–0.2)

## 2021-05-19 LAB — COMPREHENSIVE METABOLIC PANEL
ALT: 30 U/L (ref 0–44)
AST: 21 U/L (ref 15–41)
Albumin: 3.7 g/dL (ref 3.5–5.0)
Alkaline Phosphatase: 59 U/L (ref 38–126)
Anion gap: 6 (ref 5–15)
BUN: 7 mg/dL (ref 6–20)
CO2: 23 mmol/L (ref 22–32)
Calcium: 8.5 mg/dL — ABNORMAL LOW (ref 8.9–10.3)
Chloride: 108 mmol/L (ref 98–111)
Creatinine, Ser: 0.73 mg/dL (ref 0.44–1.00)
GFR, Estimated: >60 mL/min (ref >60)
Glucose, Bld: 83 mg/dL (ref 70–99)
Potassium: 3.8 mmol/L (ref 3.5–5.1)
Sodium: 137 mmol/L (ref 135–145)
Total Bilirubin: 0.5 mg/dL (ref 0.3–1.2)
Total Protein: 6.7 g/dL (ref 6.5–8.1)

## 2021-05-19 LAB — HCG, QUANTITATIVE, PREGNANCY: hCG, Beta Chain, Quant, S: 11554 m[IU]/mL — ABNORMAL HIGH (ref <5)

## 2021-05-19 LAB — PREGNANCY, URINE: Preg Test, Ur: POSITIVE — AB

## 2021-05-19 MED ORDER — CEPHALEXIN 250 MG PO CAPS
500.0000 mg | ORAL_CAPSULE | Freq: Once | ORAL | Status: AC
  Administered 2021-05-19: 500 mg via ORAL
  Filled 2021-05-19: qty 2

## 2021-05-19 MED ORDER — CEPHALEXIN 500 MG PO CAPS: 500.0000 mg | ORAL_CAPSULE | Freq: Three times a day (TID) | ORAL | 0 refills | Status: DC

## 2021-05-19 NOTE — ED Triage Notes (Signed)
Pt c/o chills, back pain, pain with urination, burning sensation in vaginal area and nausea x 2 days. ?

## 2021-05-19 NOTE — Discharge Instructions (Addendum)
You were seen here today for evaluation of your lower abdominal pain, back pain, pain with urination, as well as urinary urgency and frequency.  It was discovered that you have a urinary tract infection.  For this, we are treating you with Keflex which is an antibiotic that you will take 3 times daily for the next 7 days.  Please do not take any Azo.  You can take Tylenol as needed for pain.  Additionally, I discover that you are not pregnant.  Your ultrasound showed a single intrauterine pregnancy.  Your ultrasound did not show any cardiac activity, however this is early in your pregnancy and is likely normal.  Please have a follow-up ultrasound.  I recommend following up with your OB/GYN to schedule an appointment.  I am included more information on for trimester pregnancy and is suggested work.  Please make sure to read.  Please make sure you are taking a daily multivitamin.  Additionally, you will need to STOP taking your Gilenya for your multiple sclerosis.  You will need to call your neurologist TOMORROW to let them know of your pregnancy and possible medication change. ? ?Contact a health care provider if: ?Your symptoms do not improve or they get worse. ?You have abnormal vaginal discharge. ?Get help right away if you: ?Have a fever. ?Have nausea and vomiting. ?Have back or side pain. ?Have lower belly pain, tightness, or feel contractions in your uterus. ?Have a gush of fluid from your vagina. ?Have blood in your urine. ?

## 2021-05-19 NOTE — ED Provider Notes (Signed)
?[facility] EMERGENCY DEPARTMENT ?Provider Note ? ? ?CSN: [id number] ?Arrival date & time: 05/19/21  1411 ? ?  ? ?History ?Chief Complaint  ?Patient presents with  ? Back Pain  ? ? ?Monica Tate is a 26 y.o. female with history of multiple sclerosis presents the emergency department for evaluation of back pain, lower abdominal pain, nausea, dysuria, urinary urgency/frequency for the past 2 days is been gradually worsening.  She tried some Azo with mild relief.  She denies any diarrhea, constipation, vomiting, fevers, vaginal discharge, vaginal bleeding, vaginal itching, or any STD concern.  Urinary fecal incontinence.  Denies any urinary retention.  Denies any chest pain or shortness of breath.  She reports that she recently switched from her Nexplanon to a NuvaRing and did not know if this had anything to do with her symptoms.  She is currently on Gilenya for her MS. NKDA. ? ? ? ?Back Pain ?Associated symptoms: abdominal pain and dysuria   ?Associated symptoms: no chest pain and no fever   ? ?  ? ?Home Medications ?Prior to Admission medications   ?Medication Sig Start Date End Date Taking? Authorizing Provider  ?amoxicillin (AMOXIL) 500 MG capsule Take 1 capsule (500 mg total) by mouth 3 (three) times daily. 09/02/20   Caccavale, Sophia, PA-C  ?Fingolimod HCl (GILENYA) 0.5 MG CAPS Take 0.5 mg by mouth daily.    [provider]  ?ibuprofen (ADVIL,MOTRIN) 800 MG tablet Take 1 tablet (800 mg total) by mouth 3 (three) times daily. 11/23/17   Eustace Moore, MD  ?   ? ?Allergies    ?Patient has no known allergies.   ? ?Review of Systems   ?Review of Systems  ?Constitutional:  Negative for chills and fever.  ?Respiratory:  Negative for shortness of breath.   ?Cardiovascular:  Negative for chest pain.  ?Gastrointestinal:  Positive for abdominal pain and nausea. Negative for constipation, diarrhea and vomiting.  ?Genitourinary:  Positive for dysuria, frequency and urgency. Negative for hematuria,  vaginal bleeding and vaginal discharge.  ?Musculoskeletal:  Positive for back pain.  ? ?Physical Exam ?Updated Vital Signs ?BP 127/77   Pulse 82   Temp 98.1 ?F (36.7 ?C) (Oral)   Resp 17   Ht 5\' 7"  (1.702 m)   Wt 113.4 kg   LMP 05/13/2021   SpO2 100%   BMI 39.16 kg/m?  ?Physical Exam ?Vitals and nursing note reviewed.  ?Constitutional:   ?   General: She is not in acute distress. ?   Appearance: Normal appearance. She is not ill-appearing or toxic-appearing.  ?HENT:  ?   Head: Normocephalic and atraumatic.  ?   Mouth/Throat:  ?   Mouth: Mucous membranes are moist.  ?Eyes:  ?   General: No scleral icterus. ?Cardiovascular:  ?   Rate and Rhythm: Normal rate and regular rhythm.  ?Pulmonary:  ?   Effort: Pulmonary effort is normal. No respiratory distress.  ?   Breath sounds: Normal breath sounds.  ?   Comments: Clear to auscultation bilaterally. ?Abdominal:  ?   General: Bowel sounds are normal.  ?   Palpations: Abdomen is soft.  ?   Tenderness: There is no abdominal tenderness. There is no right CVA tenderness, left CVA tenderness, guarding or rebound.  ?   Comments: No overlying skin changes noted.  Abdomen soft, nontender.  No guarding or rebound.  Normal active bowel sounds.  No CVA tenderness.  ?Musculoskeletal:     ?   General: No tenderness or deformity.  ?  Cervical back: Normal range of motion.  ?   Comments: She has no midline or paraspinal cervical, thoracic, or lumbar tenderness.  She reports her pain feels deeper and is not reproducible by palpation.  Where she is pointing for pain is is her lower back/upper glutes.  I do not see any overlying skin changes.  There is no erythema or increased warmth to the area.  No trauma noted.  ?Skin: ?   General: Skin is warm and dry.  ?Neurological:  ?   General: No focal deficit present.  ?   Mental Status: She is alert. Mental status is at baseline.  ?   Motor: No weakness.  ?   Gait: Gait normal.  ? ? ?ED Results / Procedures / Treatments   ?Labs ?(all labs  ordered are listed, but only abnormal results are displayed) ?Labs Reviewed  ?URINALYSIS, ROUTINE W REFLEX MICROSCOPIC - Abnormal; Notable for the following components:  ?    Result Value  ? Color, Urine ORANGE (*)   ? Glucose, UA 100 (*)   ? Hgb urine dipstick MODERATE (*)   ? Bilirubin Urine SMALL (*)   ? Ketones, ur 15 (*)   ? Protein, ur 100 (*)   ? Nitrite POSITIVE (*)   ? Leukocytes,Ua LARGE (*)   ? All other components within normal limits  ?CBC WITH DIFFERENTIAL/PLATELET - Abnormal; Notable for the following components:  ? Hemoglobin 11.5 (*)   ? HCT 34.7 (*)   ? Lymphs Abs 0.3 (*)   ? All other components within normal limits  ?COMPREHENSIVE METABOLIC PANEL - Abnormal; Notable for the following components:  ? Calcium 8.5 (*)   ? All other components within normal limits  ?URINALYSIS, MICROSCOPIC (REFLEX) - Abnormal; Notable for the following components:  ? Bacteria, UA MANY (*)   ? Non Squamous Epithelial PRESENT (*)   ? All other components within normal limits  ?PREGNANCY, URINE - Abnormal; Notable for the following components:  ? Preg Test, Ur POSITIVE (*)   ? All other components within normal limits  ?HCG, QUANTITATIVE, PREGNANCY - Abnormal; Notable for the following components:  ? hCG, Beta Chain, Quant, S 11,554 (*)   ? All other components within normal limits  ? ? ?EKG ?None ? ?Radiology ? ?US OB LESS THAN 14 WEEKS WITH OB TRANSVAGINAL ? ?Result Date: 05/19/2021 ?CLINICAL DATA:  Pelvic pain. EXAM: OBSTETRIC <14 WK [place] AND TRANSVAGINAL OB US TECHNIQUE: Both transabdominal and transvaginal ultrasound examinations were performed for complete evaluation of the gestation as well as the maternal uterus, adnexal regions, and pelvic cul-de-sac. Transvaginal technique was performed to assess early pregnancy. COMPARISON:  None. FINDINGS: Intrauterine gestational sac: Single Yolk sac:  Visualized. Embryo:  Visualized. Cardiac Activity: Not Visualized. CRL:  2 mm   5 w   5 d                  [place] EDC: 01/14/2022  Subchorionic hemorrhage:  None visualized. Maternal uterus/adnexae: Corpus luteum noted in the right ovary. Left ovary is within normal limits. Trace free fluid in the pelvis. IMPRESSION: 1. Single intrauterine gestation identified. No cardiac activity identified at this time which may still be within normal limits for a gestation this age. Recommend short-term follow-up ultrasound confirm viability. 2. Likely corpus luteum in the right ovary. 3. Trace free fluid in the pelvis. Electronically Signed   By: Darliss Cheney M.D.   On: 05/19/2021 19:55   ? ?Procedures ?Procedures  ? ?Medications Ordered in  ED ?Medications  ?cephALEXin (KEFLEX) capsule 500 mg (has no administration in time range)  ? ? ?ED Course/ Medical Decision Making/ A&P ?  ?                        ?Medical Decision Making ?Amount and/or Complexity of Data Reviewed ?Labs: ordered. ?Radiology: ordered. ? ?Risk ?Prescription drug management. ? ? ?26 year old female with history of MS presents emerged department for evaluation of low back pain, low abdominal pain, urinary urgency, frequency, dysuria for the past 2 days has been gradually worsening.  Differential diagnosis includes but is not limited to urinary tract infection, pyelonephritis, kidney stone.  Vital signs unremarkable.  Patient normotensive, afebrile, normal pulse rate, satting well on room air without any increased work of breathing.  Physical exam overall unremarkable.  Patient has no back tenderness.  She has no midline or paraspinal cervical, thoracic, or lumbar tenderness.  There is no overlying skin changes or overlying erythema or warmth.  Her abdomen is soft and nontender.  She has normal active bowel sounds.  There is no other overlying skin changes noted.  She has no CVA tenderness bilaterally.  She is not complaining of any vaginal discharge or any vaginal complaints, do not think it GU exam is needed at this time. ? ?I independently reviewed and interpreted the patient's labs.   Urinalysis is orange urine, possibly from patient's Azo use.  It is mildly concentrated and she does have some glucose, which is likely a side effect of the Azo.  She has moderate amount of hemoglobin, small bil

## 2021-05-19 NOTE — ED Notes (Signed)
Pt A&OX4 ambulatory at d/c with independent steady gait. Pt verbalized understanding of d/c instructions, prescription and follow up care. 

## 2021-05-19 NOTE — ED Notes (Signed)
PA at bedside.

## 2021-05-21 LAB — GC/CHLAMYDIA PROBE AMP (~~LOC~~) NOT AT ARMC
Chlamydia: NEGATIVE
Comment: NEGATIVE
Comment: NORMAL
Neisseria Gonorrhea: NEGATIVE

## 2021-07-05 LAB — OB RESULTS CONSOLE GC/CHLAMYDIA
Chlamydia: NEGATIVE
Neisseria Gonorrhea: NEGATIVE

## 2021-08-07 LAB — OB RESULTS CONSOLE RUBELLA ANTIBODY, IGM: Rubella: IMMUNE

## 2021-08-07 LAB — OB RESULTS CONSOLE HEPATITIS B SURFACE ANTIGEN: Hepatitis B Surface Ag: NEGATIVE

## 2021-08-07 LAB — OB RESULTS CONSOLE ANTIBODY SCREEN: Antibody Screen: NEGATIVE

## 2021-08-07 LAB — OB RESULTS CONSOLE RPR: RPR: NONREACTIVE

## 2021-08-07 LAB — OB RESULTS CONSOLE ABO/RH: RH Type: NEGATIVE

## 2021-08-07 LAB — OB RESULTS CONSOLE HIV ANTIBODY (ROUTINE TESTING): HIV: NONREACTIVE

## 2021-12-20 LAB — OB RESULTS CONSOLE GBS: GBS: NEGATIVE

## 2021-12-31 ENCOUNTER — Other Ambulatory Visit: Payer: Self-pay | Admitting: Obstetrics and Gynecology

## 2022-01-06 ENCOUNTER — Telehealth (HOSPITAL_COMMUNITY): Payer: Self-pay | Admitting: *Deleted

## 2022-01-06 ENCOUNTER — Encounter (HOSPITAL_COMMUNITY): Payer: Self-pay | Admitting: *Deleted

## 2022-01-06 NOTE — Telephone Encounter (Signed)
Preadmission screen  

## 2022-01-08 ENCOUNTER — Encounter (HOSPITAL_COMMUNITY): Payer: Self-pay

## 2022-01-09 ENCOUNTER — Encounter (HOSPITAL_COMMUNITY): Payer: Self-pay | Admitting: *Deleted

## 2022-01-09 ENCOUNTER — Telehealth (HOSPITAL_COMMUNITY): Payer: Self-pay | Admitting: *Deleted

## 2022-01-09 NOTE — Telephone Encounter (Signed)
Preadmission screen  

## 2022-01-12 ENCOUNTER — Inpatient Hospital Stay (EMERGENCY_DEPARTMENT_HOSPITAL)
Admission: AD | Admit: 2022-01-12 | Discharge: 2022-01-12 | Disposition: A | Discharge status: Home / Self Care | Payer: BC Managed Care – PPO | Source: Home / Self Care | Attending: Obstetrics and Gynecology | Admitting: Obstetrics and Gynecology

## 2022-01-12 ENCOUNTER — Encounter (HOSPITAL_COMMUNITY): Payer: Self-pay | Admitting: Obstetrics and Gynecology

## 2022-01-12 DIAGNOSIS — O471 False labor at or after 37 completed weeks of gestation: Secondary | ICD-10-CM

## 2022-01-12 DIAGNOSIS — Z3A39 39 weeks gestation of pregnancy: Secondary | ICD-10-CM | POA: Insufficient documentation

## 2022-01-12 NOTE — MAU Provider Note (Signed)
  S: Ms. YOSSELIN ZOELLER is a 26 y.o. G1P0 at [redacted]w[redacted]d  who presents to MAU today complaining contractions q 5 minutes since the last 3 hrs. She denies vaginal bleeding. She denies LOF. She reports normal fetal movement.    O: BP 134/80   Pulse (!) 127   Temp 98.6 F (37 C) (Oral)   Ht 5\' 7"  (1.702 m)   LMP 05/13/2021   SpO2 97%   BMI 39.16 kg/m  GENERAL: Well-developed, well-nourished female in no acute distress.  HEAD: Normocephalic, atraumatic.  CHEST: Normal effort of breathing, regular heart rate ABDOMEN: Soft, nontender, gravid  Cervical exam:  Dilation: Fingertip Effacement (%): Thick Cervical Position: Posterior Station: Ballotable Exam by:: [email or web address], RN   Fetal Monitoring: Baseline: 135 Variability: moderate Accelerations: >2 15X15 accels  Decelerations: no decels Contractions: every 5-7  mins. mild  A: SIUP at [redacted]w[redacted]d  False labor vs early labor  P: Discharge home in stable conditions Return precautions emphasized including leakage of fluid, vaginal bleeding, increased intensity of contractions.  [redacted]w[redacted]d MD MPH OB Fellow, Faculty Practice [facility] 01/12/2022

## 2022-01-12 NOTE — MAU Note (Signed)
.  Monica Tate is a 26 y.o. at [redacted]w[redacted]d here in MAU reporting: contractions starting 3.5 hours ago, rating them a 6/10, pt reports they come every 5 minutes. Denies VB, LOF, and endorses (+) FM.    Onset of complaint: 5pm Pain score: 6/10 Vitals:   01/12/22 1944 01/12/22 1945  BP:  137/81  Pulse:  (!) 124  Temp: 98.6 F (37 C) 98.6 F (37 C)  SpO2:  99%     FHT:139 Lab orders placed from triage: mau labor eval

## 2022-01-13 ENCOUNTER — Other Ambulatory Visit: Payer: Self-pay

## 2022-01-13 ENCOUNTER — Inpatient Hospital Stay (HOSPITAL_COMMUNITY): Payer: BC Managed Care – PPO | Admitting: Anesthesiology

## 2022-01-13 ENCOUNTER — Inpatient Hospital Stay (HOSPITAL_COMMUNITY)
Admission: AD | Admit: 2022-01-13 | Discharge: 2022-01-15 | DRG: 806 | Disposition: A | Discharge status: Home / Self Care | Payer: BC Managed Care – PPO | Attending: Obstetrics and Gynecology | Admitting: Obstetrics and Gynecology

## 2022-01-13 ENCOUNTER — Encounter (HOSPITAL_COMMUNITY): Payer: Self-pay | Admitting: Obstetrics and Gynecology

## 2022-01-13 DIAGNOSIS — Z3A39 39 weeks gestation of pregnancy: Secondary | ICD-10-CM

## 2022-01-13 DIAGNOSIS — G35 Multiple sclerosis: Secondary | ICD-10-CM | POA: Diagnosis present

## 2022-01-13 DIAGNOSIS — D62 Acute posthemorrhagic anemia: Secondary | ICD-10-CM | POA: Diagnosis not present

## 2022-01-13 DIAGNOSIS — E669 Obesity, unspecified: Secondary | ICD-10-CM | POA: Diagnosis present

## 2022-01-13 DIAGNOSIS — O99354 Diseases of the nervous system complicating childbirth: Principal | ICD-10-CM | POA: Diagnosis present

## 2022-01-13 DIAGNOSIS — Z6791 Unspecified blood type, Rh negative: Secondary | ICD-10-CM

## 2022-01-13 DIAGNOSIS — O99214 Obesity complicating childbirth: Secondary | ICD-10-CM | POA: Diagnosis present

## 2022-01-13 DIAGNOSIS — O26893 Other specified pregnancy related conditions, third trimester: Secondary | ICD-10-CM | POA: Diagnosis present

## 2022-01-13 DIAGNOSIS — O9081 Anemia of the puerperium: Secondary | ICD-10-CM | POA: Diagnosis not present

## 2022-01-13 LAB — TYPE AND SCREEN
ABO/RH(D): O NEG
Antibody Screen: NEGATIVE

## 2022-01-13 LAB — CBC
HCT: 32.8 % — ABNORMAL LOW (ref 36.0–46.0)
Hemoglobin: 10.4 g/dL — ABNORMAL LOW (ref 12.0–15.0)
MCH: 26 pg (ref 26.0–34.0)
MCHC: 31.7 g/dL (ref 30.0–36.0)
MCV: 82 fL (ref 80.0–100.0)
Platelets: 296 10*3/uL (ref 150–400)
RBC: 4 MIL/uL (ref 3.87–5.11)
RDW: 14.8 % (ref 11.5–15.5)
WBC: 10.5 10*3/uL (ref 4.0–10.5)
nRBC: 0 % (ref 0.0–0.2)

## 2022-01-13 MED ORDER — EPHEDRINE 5 MG/ML INJ: 10.0000 mg | INTRAVENOUS | Status: DC | PRN

## 2022-01-13 MED ORDER — LACTATED RINGERS IV SOLN: 500.0000 mL | INTRAVENOUS | Status: DC | PRN

## 2022-01-13 MED ORDER — PHENYLEPHRINE 80 MCG/ML (10ML) SYRINGE FOR IV PUSH (FOR BLOOD PRESSURE SUPPORT): 80.0000 ug | PREFILLED_SYRINGE | INTRAVENOUS | Status: DC | PRN

## 2022-01-13 MED ORDER — LACTATED RINGERS IV SOLN: 500.0000 mL | Freq: Once | INTRAVENOUS | Status: DC

## 2022-01-13 MED ORDER — ACETAMINOPHEN 325 MG PO TABS: 650.0000 mg | ORAL_TABLET | ORAL | Status: DC | PRN

## 2022-01-13 MED ORDER — LIDOCAINE HCL (PF) 1 % IJ SOLN: 30.0000 mL | INTRAMUSCULAR | Status: DC | PRN

## 2022-01-13 MED ORDER — ONDANSETRON HCL 4 MG/2ML IJ SOLN: 4.0000 mg | Freq: Four times a day (QID) | INTRAMUSCULAR | Status: DC | PRN

## 2022-01-13 MED ORDER — DIPHENHYDRAMINE HCL 50 MG/ML IJ SOLN: 12.5000 mg | INTRAMUSCULAR | Status: DC | PRN

## 2022-01-13 MED ORDER — OXYTOCIN BOLUS FROM INFUSION: 333.0000 mL | Freq: Once | INTRAVENOUS | Status: DC

## 2022-01-13 MED ORDER — OXYTOCIN-SODIUM CHLORIDE 30-0.9 UT/500ML-% IV SOLN: 2.5000 [IU]/h | INTRAVENOUS | Status: DC

## 2022-01-13 MED ORDER — SOD CITRATE-CITRIC ACID 500-334 MG/5ML PO SOLN: 30.0000 mL | ORAL | Status: DC | PRN

## 2022-01-13 MED ORDER — OXYTOCIN BOLUS FROM INFUSION
333.0000 mL | Freq: Once | INTRAVENOUS | Status: AC
  Administered 2022-01-14: 333 mL via INTRAVENOUS

## 2022-01-13 MED ORDER — OXYCODONE-ACETAMINOPHEN 5-325 MG PO TABS: 1.0000 | ORAL_TABLET | ORAL | Status: DC | PRN

## 2022-01-13 MED ORDER — LACTATED RINGERS IV SOLN: INTRAVENOUS | Status: DC

## 2022-01-13 MED ORDER — FENTANYL-BUPIVACAINE-NACL 0.5-0.125-0.9 MG/250ML-% EP SOLN
12.0000 mL/h | EPIDURAL | Status: DC | PRN
  Administered 2022-01-13: 12 mL/h via EPIDURAL
  Filled 2022-01-13: qty 250

## 2022-01-13 MED ORDER — OXYCODONE-ACETAMINOPHEN 5-325 MG PO TABS: 2.0000 | ORAL_TABLET | ORAL | Status: DC | PRN

## 2022-01-13 MED ORDER — FLEET ENEMA 7-19 GM/118ML RE ENEM: 1.0000 | ENEMA | RECTAL | Status: DC | PRN

## 2022-01-13 MED ORDER — OXYTOCIN-SODIUM CHLORIDE 30-0.9 UT/500ML-% IV SOLN
2.5000 [IU]/h | INTRAVENOUS | Status: DC
  Filled 2022-01-13: qty 500

## 2022-01-13 MED ORDER — FENTANYL CITRATE (PF) 100 MCG/2ML IJ SOLN
50.0000 ug | INTRAMUSCULAR | Status: DC | PRN
  Administered 2022-01-13 (×2): 100 ug via INTRAVENOUS
  Filled 2022-01-13 (×2): qty 2

## 2022-01-13 MED ORDER — LIDOCAINE HCL (PF) 1 % IJ SOLN
INTRAMUSCULAR | Status: DC | PRN
  Administered 2022-01-13: 3 mL via EPIDURAL
  Administered 2022-01-13: 5 mL via EPIDURAL

## 2022-01-13 NOTE — Anesthesia Procedure Notes (Signed)
Epidural Patient location during procedure: OB Start time: 01/13/2022 7:40 PM End time: 01/13/2022 7:46 PM  Staffing Anesthesiologist: Linton Rump, MD Performed: anesthesiologist   Preanesthetic Checklist Completed: patient identified, IV checked, site marked, risks and benefits discussed, surgical consent, monitors and equipment checked, pre-op evaluation and timeout performed  Epidural Patient position: sitting Prep: DuraPrep and site prepped and draped Patient monitoring: continuous pulse ox and blood pressure Approach: midline Location: L3-L4 Injection technique: LOR saline  Needle:  Needle type: Tuohy  Needle gauge: 17 G Needle length: 9 cm and 9 Needle insertion depth: 7.5 cm Catheter type: closed end flexible Catheter size: 19 Gauge Catheter at skin depth: 12 cm Test dose: negative  Assessment Events: blood not aspirated, injection not painful, no injection resistance, no paresthesia and negative IV test  Additional Notes The patient has requested an epidural for labor pain management. Risks and benefits including, but not limited to, infection, bleeding, local anesthetic toxicity, headache, hypotension, back pain, block failure, etc. were discussed with the patient. The patient expressed understanding and consented to the procedure. I confirmed that the patient has no bleeding disorders and is not taking blood thinners. I confirmed the patient's last platelet count with the nurse. A time-out was performed immediately prior to the procedure. Please see nursing documentation for vital signs. Sterile technique was used throughout the whole procedure. Once LOR achieved, the epidural catheter threaded easily without resistance. Aspiration of the catheter was negative for blood and CSF. The epidural was dosed slowly and an infusion was started.  2 attempt(s)Reason for block:procedure for pain

## 2022-01-13 NOTE — Progress Notes (Signed)
   Labor Progress Note  Monica Tate is a 26 y.o. female, G1P0000, IUP at 39.5 weeks, presenting for latent labor with intact membranes, GBS-. Obesity BMI 40. Anemia 10.8. MS. RH-, received rhogam at 30 weeks LR.   Subjective: Pt stable and comfortable with epidural. Reviewed POC, R/B/A of AROM, pt endorses desire for AROM. Reviewed Birth plan with [facility], [facility].  Patient Active Problem List   Diagnosis Date Noted   Indication for care in labor or delivery 01/13/2022   Multiple sclerosis (HCC) 01/13/2022   MS (multiple sclerosis) (HCC) 11/23/2017   Optic neuritis, left 01/21/2012   Objective: BP 113/63   Pulse (!) 110   Temp 97.6 F (36.4 C) (Oral)   Resp 18   Ht 5\' 7"  (1.702 m)   Wt 131.1 kg   LMP 05/13/2021   SpO2 98%   BMI 45.27 kg/m  I/O last 3 completed shifts: In: 179.1 [I.V.:179.1] Out: -  No intake/output data recorded. NST: FHR baseline 140 bpm, Variability: moderate, Accelerations:present, Decelerations:  Absent= Cat 1/Reactive CTX:  regular, every 2-3 minutes Uterus gravid, soft non tender, moderate to palpate with contractions.  SVE:  Dilation: 6 Effacement (%): 90 Station: 0 Exam by:: Sharilynn Cassity CNM AROM, clear moderate amount tolerated well.   Assessment:  Monica Tate is a 26 y.o. female, G1P0000, IUP at 39.5 weeks, presenting for latent labor with intact membranes, GBS-. Obesity BMI 40. Anemia 10.8. MS. RH-, received rhogam at 30 weeks LR. Progress ing in active labor.  Patient Active Problem List   Diagnosis Date Noted   Indication for care in labor or delivery 01/13/2022   Multiple sclerosis (HCC) 01/13/2022   MS (multiple sclerosis) (HCC) 11/23/2017   Optic neuritis, left 01/21/2012   NICHD: Category 1  Membranes:  AROM @ 2031, clear, no s/s of infection  Induction:    Cytotec xN/A  Foley Bulb: N/A  Pitocin - N/A  Pain management:               IV pain management: x Fentanyl on 12/25 @ 1715, 1834  Nitrous: PRN             Epidural  placement:  at 2000 on 12/25  GBS Negative   Plan: Continue labor plan Continuous monitoring Rest Frequent position changes to facilitate fetal rotation and descent. Will reassess with cervical exam at 4 hours or earlier if necessary Anticipate labor progression and vaginal delivery.  RH-: Rhogam PP if indicated.   [facility] CNM, FNP-C, PMHNP-BC  [id number]  [place], [place]  Cell: [phone]  Office Phone: [phone] Fax: [phone] 01/13/2022  9:04 PM

## 2022-01-13 NOTE — Anesthesia Preprocedure Evaluation (Addendum)
Anesthesia Evaluation  Patient identified by MRN, date of birth, ID band Patient awake    Reviewed: Allergy & Precautions, NPO status , Patient's Chart, lab work & pertinent test results  History of Anesthesia Complications Negative for: history of anesthetic complications  Airway Mallampati: III  TM Distance: >3 FB Neck ROM: Full    Dental no notable dental hx.    Pulmonary neg pulmonary ROS   Pulmonary exam normal breath sounds clear to auscultation       Cardiovascular negative cardio ROS  Rhythm:Regular Rate:Normal     Neuro/Psych Seizures - (h/o febrile seizures),   Neuromuscular disease (MS, no recent flares, not on medications)    GI/Hepatic negative GI ROS, Neg liver ROS,,,  Endo/Other  negative endocrine ROS    Renal/GU negative Renal ROS     Musculoskeletal   Abdominal  (+) + obese  Peds  Hematology negative hematology ROS (+)   Anesthesia Other Findings   Reproductive/Obstetrics (+) Pregnancy                             Anesthesia Physical Anesthesia Plan  ASA: 3  Anesthesia Plan: Epidural   Post-op Pain Management:    Induction:   PONV Risk Score and Plan:   Airway Management Planned:   Additional Equipment:   Intra-op Plan:   Post-operative Plan:   Informed Consent: I have reviewed the patients History and Physical, chart, labs and discussed the procedure including the risks, benefits and alternatives for the proposed anesthesia with the patient or authorized representative who has indicated his/her understanding and acceptance.       Plan Discussed with: Anesthesiologist  Anesthesia Plan Comments: (I have discussed risks of neuraxial anesthesia including but not limited to infection, bleeding, nerve injury, back pain, headache, seizures, and failure of block. Patient denies bleeding disorders and is not currently anticoagulated. Labs have been reviewed.  Risks and benefits discussed. All patient's questions answered.  )       Anesthesia Quick Evaluation

## 2022-01-13 NOTE — MAU Note (Signed)
.  Monica Tate is a 26 y.o. at [redacted]w[redacted]d here in MAU reporting: ctx that started yesterday. Became more intense and are now every 2-4 minutes. Denies VB or LOF. +FM.   Pain score: 8 Lab orders placed from triage:  mau labor

## 2022-01-13 NOTE — H&P (Signed)
Monica Tate is a 26 y.o. female, G1P0000, IUP at 39.5 weeks, presenting for latent labor with intact membranes, GBS-. Obesity BMI 40. Anemia 10.8. MS. RH-, received rhogam at 30 weeks LR. Pt endorse + Fm. Denies vaginal leakage. Denies vaginal bleeding.   Patient Active Problem List   Diagnosis Date Noted   Indication for care in labor or delivery 01/13/2022   Multiple sclerosis (HCC) 01/13/2022   MS (multiple sclerosis) (HCC) 11/23/2017   Optic neuritis, left 01/21/2012     Active Ambulatory Problems    Diagnosis Date Noted   Optic neuritis, left 01/21/2012   MS (multiple sclerosis) (HCC) 11/23/2017   Resolved Ambulatory Problems    Diagnosis Date Noted   No Resolved Ambulatory Problems   Past Medical History:  Diagnosis Date   Febrile seizures (HCC)       No medications prior to admission.    Past Medical History:  Diagnosis Date   Febrile seizures (HCC)    MS (multiple sclerosis) (HCC)      No current facility-administered medications on file prior to encounter.   No current outpatient medications on file prior to encounter.     No Known Allergies  History of present pregnancy: Pt Info/Preference:  Screening/Consents:  Labs:   EDD: Estimated Date of Delivery: 01/15/22  Establised: Patient's last menstrual period was 05/13/2021.  Anatomy Scan: Date: 20 weeks Placenta Location: posterior Genetic Screen: [place]:LR AFP:  First Tri: Quad: Horizon: WNL  Office: [facility]            First PNV: 11.1 weeks Blood Type --/--/O NEG (12/25 1443)  Language: English Last PNV: UTD Rhogam  10/20 @ 30 weeks  Flu Vaccine:  Declined   Antibody NEG (12/25 1443)  TDaP vaccine Declined   GTT: Early: 5.4 Third Trimester: 100  Feeding Plan: Br/Bt BTL: no Rubella: Immune (07/19 0000)  Contraception: ??? VBAC: no RPR: Nonreactive (07/19 0000)   Circumcision: ???   HBsAg: Negative (07/19 0000)  [facility]ian[facility]   HIV: Non-reactive (07/19 0000)   Prenatal Classes: no  Additional [place]: 12/1 BPP 8/8, EFW 59% with lagging HC 7%, posterior placenta, AFI WNL  GBS: Negative/-- (12/01 0000)(For PCN allergy, check sensitivities)       Chlamydia: neg    MFM Referral/Consult:  GC: neg  Support Person: partner   PAP: 2023 HPV +, will get colop PP  Pain Management: epidural Neonatologist Referral:  Hgb Electrophoresis:  AA  Birth Plan: DCC   Hgb NOB: 10.8    28W: 10.7   OB History     Gravida  1   Para      Term      Preterm      AB      Living         SAB      IAB      Ectopic      Multiple      Live Births             Past Medical History:  Diagnosis Date   Febrile seizures (HCC)    MS (multiple sclerosis) (HCC)    Past Surgical History:  Procedure Laterality Date   LIPOSUCTION     MYRINGOTOMY     Family History: family history includes Diabetes in her maternal grandmother and paternal grandmother; Hypertension in her maternal grandfather, maternal grandmother, paternal grandfather, and paternal grandmother; Lupus in her father and maternal grandmother; Varicose Veins in her mother. Social History:  reports that she has never smoked. She has never used smokeless tobacco. She reports that she does not currently use alcohol. She reports that she does not use drugs.   Prenatal Transfer Tool  Maternal Diabetes: No Genetic Screening: Normal Maternal Ultrasounds/Referrals: Normal Fetal Ultrasounds or other Referrals:  None Maternal Substance Abuse:  No Significant Maternal Medications:  None Significant Maternal Lab Results: Group B Strep negative  ROS:  Review of Systems  Constitutional: Negative.   HENT: Negative.    Eyes: Negative.   Respiratory: Negative.    Cardiovascular: Negative.   Gastrointestinal:  Positive for abdominal pain.  Genitourinary: Negative.   Musculoskeletal: Negative.   Skin: Negative.   Neurological: Negative.   Endo/Heme/Allergies: Negative.   Psychiatric/Behavioral: Negative.       Physical  Exam: BP 109/71   Pulse (!) 115   Temp 97.6 F (36.4 C) (Oral)   Resp 18   Ht 5\' 7"  (1.702 m)   Wt 131.1 kg   LMP 05/13/2021   SpO2 98%   BMI 45.27 kg/m   Physical Exam Vitals and nursing note reviewed.  Constitutional:      Appearance: Normal appearance.  HENT:     Head: Normocephalic and atraumatic.     Nose: Nose normal.     Mouth/Throat:     Mouth: Mucous membranes are moist.  Eyes:     Pupils: Pupils are equal, round, and reactive to light.  Cardiovascular:     Rate and Rhythm: Normal rate and regular rhythm.     Pulses: Normal pulses.     Heart sounds: Normal heart sounds.  Pulmonary:     Effort: Pulmonary effort is normal.     Breath sounds: Normal breath sounds.  Abdominal:     General: Bowel sounds are normal.  Genitourinary:    Comments: Uterus gravida equal to dates, pelvis adaqaute Musculoskeletal:        General: Normal range of motion.     Cervical back: Normal range of motion and neck supple.  Skin:    General: Skin is warm.     Capillary Refill: Capillary refill takes less than 2 seconds.  Neurological:     General: No focal deficit present.     Mental Status: She is alert.  Psychiatric:        Mood and Affect: Mood normal.      NST: FHR baseline 135 bpm, Variability: moderate, Accelerations:present, Decelerations:  Absent= Cat 1/Reactive UC:   irregular, every 2-4 minutes SVE:   Dilation: 4.5 Effacement (%): 70 Station: -2 Exam by:: [email or web address] RN, vertex verified by fetal sutures.  Leopold's: Position vertex, EFW 7.5lbs via leopold's.   Labs: Results for orders placed or performed during the hospital encounter of 01/13/22 (from the past 24 hour(s))  CBC     Status: Abnormal   Collection Time: 01/13/22  2:43 PM  Result Value Ref Range   WBC 10.5 4.0 - 10.5 K/uL   RBC 4.00 3.87 - 5.11 MIL/uL   Hemoglobin 10.4 (L) 12.0 - 15.0 g/dL   HCT 01/15/22 (L) 15.4 - 00.8 %   MCV 82.0 80.0 - 100.0 fL   MCH 26.0 26.0 - 34.0 pg   MCHC 31.7 30.0  - 36.0 g/dL   RDW 67.6 19.5 - 09.3 %   Platelets 296 150 - 400 K/uL   nRBC 0.0 0.0 - 0.2 %  Type and screen [facility]     Status: None   Collection Time: 01/13/22  2:43 PM  Result Value Ref Range   ABO/RH(D) O NEG    Antibody Screen NEG    Sample Expiration      01/16/2022,2359 Performed at [facility], [address], [place], [place] [address]     Imaging:  No results found.  MAU Course: Orders Placed This Encounter  Procedures   CBC   RPR   Diet clear liquid Room service appropriate? Yes; Fluid consistency: Thin   Vitals signs per unit policy   Notify physician (specify)   Fetal monitoring per unit policy   Activity as tolerated   Cervical Exam   Measure blood pressure post delivery every 15 min x 1 hour then every 30 min x 1 hour   Fundal check post delivery every 15 min x 1 hour then every 30 min x 1 hour   Apply Labor & Delivery Care Plan   If Rapid HIV test positive or known HIV positive: initiate AZT orders   May in and out cath x 2 for inability to void   Insert urethral catheter X 1 PRN If Coude Catheter is chosen, qualified resources by campus can be found in the clinical skills nursing procedure for Coude Catheter 1. If straight catheterized > 2 times or patient unable to void post epidural plac...   Refer to Sidebar Report Urinary (Foley) Catheter Indications   Refer to Sidebar Report Post Indwelling Urinary Catheter Removal and Intervention Guidelines   Discontinue foley prior to vaginal delivery   Initiate Carrier Fluid Protocol   Initiate Oral Care Protocol   Patient may have epidural placement upon request   May use local infiltration of 1% lidocaine plain to produce a skin wheal prior to IV insertion   Notify in-house Anesthesia team of nausea and vomiting greater than 5 hours   Assess for signs/symptoms of PIH/preeclampsia   RN to place order for: CBC if one has not been drawn in the past 6 hours for all patients with  hypertensive disease, pre-eclampsia, eclampsia, thrombocytopenia or previous PLTC<150,000.   Identify to Anesthesia if patient plans to have postpartum tubal ligation; do not remove epidural without discussion with Anesthesiologist   Vital signs following Epidural Placement, re-bolus or re-dose monitor patient's BP and oxygen saturation every 5 minutes for 30 minutes   Pain Assessment Document numeric pain score   RN to remain at bedside continuously for 30 minutes post epidural placement, post re-bolus / re-dose   Notify Anesthesia if the patient becomes short of breath or complains of heaviness in chest, chest pain, and/or unrelieved pain   Notify Anesthesia prior to discontinuing epidural infusion   If the patient plans to have a postpartum tubal ligation, cover epidural catheter with non-injectable port cap and tape to patient's shoulder   Notify physician (specify) Notify Anesthesia MD immediately post-epidural infusion for: 1-The patient c/o back pain beyond tenderness at insertion site. 2-Progressively worsening motor and / or sensory loss. 3-Bowel or Bladder Incontinence 4-It ha...   Vitals signs per unit policy   Notify physician (specify)   Fetal monitoring per unit policy   Activity as tolerated   Cervical Exam   Measure blood pressure post delivery every 15 min x 1 hour then every 30 min x 1 hour   Fundal check post delivery every 15 min x 1 hour then every 30 min x 1 hour   Apply Labor & Delivery Care Plan   If Rapid HIV test positive or known HIV positive: initiate AZT orders   May in and  out cath x 2 for inability to void   Insert urethral catheter X 1 PRN If Coude Catheter is chosen, qualified resources by campus can be found in the clinical skills nursing procedure for Coude Catheter 1. If straight catheterized > 2 times or patient unable to void post epidural plac...   Refer to Sidebar Report Urinary (Foley) Catheter Indications   Refer to Sidebar Report Post Indwelling Urinary  Catheter Removal and Intervention Guidelines   Discontinue foley prior to vaginal delivery   Initiate Carrier Fluid Protocol   Initiate Oral Care Protocol   SCDs   Patient may have epidural placement upon request   Full code   Nitrous Oxide 50%/Oxygen 50%   Type and screen [facility]   Insert and maintain IV Line   Epidural catheter may be discontinued following delivery if hemodynamically stable with no active bleeding and no post-partum tubal planned. Contact Anesthesiology for any complications.   Insert and maintain IV Line   Admit to Inpatient (patient's expected length of stay will be greater than 2 midnights or inpatient only procedure)   Admit to Inpatient (patient's expected length of stay will be greater than 2 midnights or inpatient only procedure)   Meds ordered this encounter  Medications   DISCONTD: lactated ringers infusion   DISCONTD: oxytocin (PITOCIN) IV BOLUS FROM BAG   DISCONTD: oxytocin (PITOCIN) IV infusion 30 units in NS 500 mL - Premix   DISCONTD: lactated ringers infusion 500-1,000 mL   DISCONTD: acetaminophen (TYLENOL) tablet 650 mg   oxyCODONE-acetaminophen (PERCOCET/ROXICET) 5-325 MG per tablet 1 tablet   oxyCODONE-acetaminophen (PERCOCET/ROXICET) 5-325 MG per tablet 2 tablet   sodium phosphate (FLEET) 7-19 GM/118ML enema 1 enema   DISCONTD: ondansetron (ZOFRAN) injection 4 mg   sodium citrate-citric acid (ORACIT) solution 30 mL   DISCONTD: lidocaine (PF) (XYLOCAINE) 1 % injection 30 mL   fentaNYL (SUBLIMAZE) injection 50-100 mcg   DISCONTD: ePHEDrine injection 10 mg   DISCONTD: PHENYLephrine 80 mcg/ml in normal saline Adult IV Push Syringe (For Blood Pressure Support)   lactated ringers infusion 500 mL   fentaNYL 2 mcg/mL w/ bupivacaine 0.125% in NS 250 mL epidural infusion   diphenhydrAMINE (BENADRYL) injection 12.5 mg   ePHEDrine injection 10 mg   PHENYLephrine 80 mcg/ml in normal saline Adult IV Push Syringe (For Blood Pressure  Support)   lactated ringers infusion   oxytocin (PITOCIN) IV BOLUS FROM BAG   oxytocin (PITOCIN) IV infusion 30 units in NS 500 mL - Premix   lactated ringers infusion 500-1,000 mL   acetaminophen (TYLENOL) tablet 650 mg   ondansetron (ZOFRAN) injection 4 mg   sodium citrate-citric acid (ORACIT) solution 30 mL   lidocaine (PF) (XYLOCAINE) 1 % injection 30 mL    Assessment/Plan: Monica Tate is a 26 y.o. female, G1P0000, IUP at 39.5 weeks, presenting for latent labor with intact membranes, GBS-. Obesity BMI 40. Anemia 10.8. MS. RH-, received rhogam at 30 weeks LR. Pt endorse + Fm. Denies vaginal leakage. Denies vaginal bleeding.    FWB: Cat 1 Fetal Tracing.   Plan: Admit to Birthing Suite per consult with Dr Richardson Dopp Routine [facility] orders Pain med/epidural prn Expectant management.  RH-: Rhogam PP if indicated.  Anticipate labor progression   [facility] CNM, FNP-C, PMHNP-BC  [address]  [place], [place] [address]  Cell: [phone]  Office Phone: [phone] Fax: [phone] 01/13/2022  7:16 PM

## 2022-01-13 NOTE — MAU Note (Signed)
Pt informed that the ultrasound is considered a limited OB ultrasound and is not intended to be a complete ultrasound exam.  Patient also informed that the ultrasound is not being completed with the intent of assessing for fetal or placental anomalies or any pelvic abnormalities.  Explained that the purpose of today's ultrasound is to assess for  presentation.  Patient acknowledges the purpose of the exam and the limitations of the study.     Pt is vertex.  

## 2022-01-14 ENCOUNTER — Encounter (HOSPITAL_COMMUNITY): Payer: Self-pay | Admitting: Obstetrics and Gynecology

## 2022-01-14 DIAGNOSIS — D62 Acute posthemorrhagic anemia: Secondary | ICD-10-CM | POA: Diagnosis not present

## 2022-01-14 LAB — CBC
HCT: 29.9 % — ABNORMAL LOW (ref 36.0–46.0)
Hemoglobin: 9.8 g/dL — ABNORMAL LOW (ref 12.0–15.0)
MCH: 26.5 pg (ref 26.0–34.0)
MCHC: 32.8 g/dL (ref 30.0–36.0)
MCV: 80.8 fL (ref 80.0–100.0)
Platelets: 263 10*3/uL (ref 150–400)
RBC: 3.7 MIL/uL — ABNORMAL LOW (ref 3.87–5.11)
RDW: 14.7 % (ref 11.5–15.5)
WBC: 12.4 10*3/uL — ABNORMAL HIGH (ref 4.0–10.5)
nRBC: 0 % (ref 0.0–0.2)

## 2022-01-14 LAB — RPR: RPR Ser Ql: NONREACTIVE

## 2022-01-14 MED ORDER — COCONUT OIL OIL: 1.0000 | TOPICAL_OIL | Status: DC | PRN

## 2022-01-14 MED ORDER — ACETAMINOPHEN 325 MG PO TABS: 650.0000 mg | ORAL_TABLET | ORAL | Status: DC | PRN

## 2022-01-14 MED ORDER — DIPHENHYDRAMINE HCL 25 MG PO CAPS: 25.0000 mg | ORAL_CAPSULE | Freq: Four times a day (QID) | ORAL | Status: DC | PRN

## 2022-01-14 MED ORDER — IBUPROFEN 600 MG PO TABS
600.0000 mg | ORAL_TABLET | Freq: Four times a day (QID) | ORAL | Status: DC
  Administered 2022-01-14 – 2022-01-15 (×6): 600 mg via ORAL
  Filled 2022-01-14 (×6): qty 1

## 2022-01-14 MED ORDER — TETANUS-DIPHTH-ACELL PERTUSSIS 5-2.5-18.5 LF-MCG/0.5 IM SUSY: 0.5000 mL | PREFILLED_SYRINGE | Freq: Once | INTRAMUSCULAR | Status: DC

## 2022-01-14 MED ORDER — SIMETHICONE 80 MG PO CHEW: 80.0000 mg | CHEWABLE_TABLET | ORAL | Status: DC | PRN

## 2022-01-14 MED ORDER — ZOLPIDEM TARTRATE 5 MG PO TABS: 5.0000 mg | ORAL_TABLET | Freq: Every evening | ORAL | Status: DC | PRN

## 2022-01-14 MED ORDER — POLYSACCHARIDE IRON COMPLEX 150 MG PO CAPS
150.0000 mg | ORAL_CAPSULE | Freq: Every day | ORAL | Status: DC
  Administered 2022-01-14 – 2022-01-15 (×2): 150 mg via ORAL
  Filled 2022-01-14 (×2): qty 1

## 2022-01-14 MED ORDER — DIBUCAINE (PERIANAL) 1 % EX OINT: 1.0000 | TOPICAL_OINTMENT | CUTANEOUS | Status: DC | PRN

## 2022-01-14 MED ORDER — PRENATAL MULTIVITAMIN CH
1.0000 | ORAL_TABLET | Freq: Every day | ORAL | Status: DC
  Administered 2022-01-14 – 2022-01-15 (×2): 1 via ORAL
  Filled 2022-01-14 (×2): qty 1

## 2022-01-14 MED ORDER — SENNOSIDES-DOCUSATE SODIUM 8.6-50 MG PO TABS
2.0000 | ORAL_TABLET | Freq: Every day | ORAL | Status: DC
  Administered 2022-01-15: 2 via ORAL
  Filled 2022-01-14: qty 2

## 2022-01-14 MED ORDER — RHO D IMMUNE GLOBULIN 1500 UNIT/2ML IJ SOSY
300.0000 ug | PREFILLED_SYRINGE | Freq: Once | INTRAMUSCULAR | Status: AC
  Administered 2022-01-14: 300 ug via INTRAVENOUS
  Filled 2022-01-14: qty 2

## 2022-01-14 MED ORDER — BENZOCAINE-MENTHOL 20-0.5 % EX AERO
1.0000 | INHALATION_SPRAY | CUTANEOUS | Status: DC | PRN
  Administered 2022-01-15: 1 via TOPICAL
  Filled 2022-01-14 (×2): qty 56

## 2022-01-14 MED ORDER — WITCH HAZEL-GLYCERIN EX PADS: 1.0000 | MEDICATED_PAD | CUTANEOUS | Status: DC | PRN

## 2022-01-14 MED ORDER — INFLUENZA VAC SPLIT QUAD 0.5 ML IM SUSY: 0.5000 mL | PREFILLED_SYRINGE | INTRAMUSCULAR | Status: DC

## 2022-01-14 MED ORDER — ONDANSETRON HCL 4 MG PO TABS: 4.0000 mg | ORAL_TABLET | ORAL | Status: DC | PRN

## 2022-01-14 MED ORDER — ONDANSETRON HCL 4 MG/2ML IJ SOLN: 4.0000 mg | INTRAMUSCULAR | Status: DC | PRN

## 2022-01-14 NOTE — Anesthesia Postprocedure Evaluation (Signed)
Anesthesia Post Note  Patient: Monica Tate  Procedure(s) Performed: AN AD HOC LABOR EPIDURAL     Patient location during evaluation: Mother Baby Anesthesia Type: Epidural Level of consciousness: awake, oriented and awake and alert Pain management: pain level controlled Vital Signs Assessment: post-procedure vital signs reviewed and stable Respiratory status: spontaneous breathing, respiratory function stable and nonlabored ventilation Cardiovascular status: stable Postop Assessment: no headache, adequate PO intake, able to ambulate, patient able to bend at knees and no apparent nausea or vomiting Anesthetic complications: no   No notable events documented.  Last Vitals:  Vitals:   01/14/22 0300 01/14/22 0559  BP: 103/76 110/79  Pulse: (!) 120 (!) 139  Resp:  18  Temp: 36.7 C 36.6 C  SpO2:  100%    Last Pain:  Vitals:   01/14/22 0559  TempSrc: Oral  PainSc: 0-No pain   Pain Goal:                   Damyan Corne

## 2022-01-14 NOTE — Plan of Care (Signed)

## 2022-01-14 NOTE — Lactation Note (Signed)
This note was copied from a baby's chart. Lactation Consultation Note  Patient Name: Monica Tate Today's Date: 01/14/2022 Reason for consult: Initial assessment;Primapara;1st time breastfeeding;Term Age:25 hours   P1: Term infant at 39+6 weeks Feeding preference: Breast  Mother was attempting to latch "Monica Tate" when I arrived.  Baby was swaddled and very sleepy.  Offered to assist with waking and latching; mother receptive.  Suggested mother feed STS and removed blanket.  Mother interested in doing the cross cradle hold.  Assisted to latch "Monica Tate" and, with gentle stimulation, she began to actively feed.  Reviewed breast feeding basics while observing her feed for 16 minutes with intermittent swallows.  Demonstrated breast compressions and gentle stimulation to keep her actively engaged with her feeding.  Support person at bedside observing.  Encouraged to feed 8-12 times/24 hours or sooner if "Monica Tate" shows cues.  Discussed cluster feeding after 24 hours.  Suggested mother call for latch assistance as needed.   Maternal Data Has patient been taught Hand Expression?: Yes Does the patient have breastfeeding experience prior to this delivery?: No  Feeding Mother's Current Feeding Choice: Breast Milk  LATCH Score Latch: Repeated attempts needed to sustain latch, nipple held in mouth throughout feeding, stimulation needed to elicit sucking reflex.  Audible Swallowing: A few with stimulation  Type of Nipple: Everted at rest and after stimulation  Comfort (Breast/Nipple): Soft / non-tender  Hold (Positioning): Assistance needed to correctly position infant at breast and maintain latch.  LATCH Score: 7   Lactation Tools Discussed/Used    Interventions Interventions: Breast feeding basics reviewed;Assisted with latch;Skin to skin;Breast massage;Hand express;Breast compression;Position options;Support pillows;Adjust position;Education;LC Services brochure  Discharge Pump:  Personal Quarry manager)  Consult Status Consult Status: Follow-up Date: 01/15/22 Follow-up type: In-patient    Monica Tate 01/14/2022, 6:07 AM

## 2022-01-15 ENCOUNTER — Inpatient Hospital Stay (HOSPITAL_COMMUNITY)
Admission: RE | Admit: 2022-01-15 | Payer: BC Managed Care – PPO | Source: Home / Self Care | Admitting: Obstetrics and Gynecology

## 2022-01-15 ENCOUNTER — Inpatient Hospital Stay (HOSPITAL_COMMUNITY): Payer: BC Managed Care – PPO

## 2022-01-15 LAB — RH IG WORKUP (INCLUDES ABO/RH)
Fetal Screen: NEGATIVE
Gestational Age(Wks): 39
Unit division: 0

## 2022-01-15 MED ORDER — ACETAMINOPHEN 325 MG PO TABS: 650.0000 mg | ORAL_TABLET | ORAL | 1 refills | Status: AC | PRN

## 2022-01-15 MED ORDER — IBUPROFEN 600 MG PO TABS: 600.0000 mg | ORAL_TABLET | Freq: Four times a day (QID) | ORAL | 0 refills | Status: AC

## 2022-01-15 MED ORDER — PRENATAL MULTIVITAMIN CH: 1.0000 | ORAL_TABLET | Freq: Every day | ORAL | 4 refills | Status: AC

## 2022-01-15 MED ORDER — BENZOCAINE-MENTHOL 20-0.5 % EX AERO: 1.0000 | INHALATION_SPRAY | CUTANEOUS | 1 refills | Status: AC | PRN

## 2022-01-15 MED ORDER — POLYSACCHARIDE IRON COMPLEX 150 MG PO CAPS: 150.0000 mg | ORAL_CAPSULE | Freq: Every day | ORAL | 1 refills | Status: AC

## 2022-01-15 NOTE — Lactation Note (Incomplete)
This note was copied from a baby's chart. Lactation Consultation Note  Patient Name: Monica Tate [id number] Date: 01/15/2022 Reason for consult: Follow-up assessment Age:25 hours  Infant had just finished a feeding when I entered room. Infant was content. Mom reports hearing swallows (the sound was taught to her by a previous LC).   Mom with c/o sore nipples, but her nipples are intact (except for holes from previous nipple piercings). Mom pointed out to me a very minute-sized area on her nipple that could potentially be a very small skin tag? Mom had previously pulled on it (a couple of weeks to a couple of months ago) and then it came back. Mom was concerned about baby swallowing it; I assured Mom that b/c of its small size (pinprick-sized), it would not be an issue if infant swallowed it.    In mom's chart, it had been mentioned that she was thinking of encapsulating her placenta. I explained that may not be a good idea due to the progesterone load impacting her milk supply/milk coming to volume.   Feeding frequency was reviewed.  Mom will need size 21 flanges for Mom Cozy or will need to purchase inserts.    Maternal Data Does the patient have breastfeeding experience prior to this delivery?: No  Interventions Interventions: Education  Discharge Pump: Personal WIC Program: No  Consult Status Consult Status: Complete    Remigio Eisenmenger 01/15/2022, 11:22 AM

## 2022-01-15 NOTE — Discharge Summary (Signed)
Postpartum Discharge Summary  Date of Service updated12/27/23     Patient Name: Monica Tate DOB: 03/12/1995 MRN: [id number]  Date of admission: 01/13/2022 Delivery date:01/14/2022  Delivering provider: Noralyn Pick  Date of discharge: 01/15/2022  Admitting diagnosis: Indication for care in labor or delivery [O75.9] Multiple sclerosis ([place]) [G35] Intrauterine pregnancy: [redacted]w[redacted]d    Secondary diagnosis:  Principal Problem:   Indication for care in labor or delivery Active Problems:   Multiple sclerosis (HEugenio Saenz   Obesity   SVD (spontaneous vaginal delivery)   Normal postpartum course   Acute blood loss anemia  Additional problems: none    Discharge diagnosis: Term Pregnancy Delivered                                              Post partum procedures: na Augmentation: AROM Complications: None  Hospital course: Onset of Labor With Vaginal Delivery      26y.o. yo G1P1001 at 387w6das admitted in Latent Labor on 01/13/2022. Labor course was complicated by nothing  Membrane Rupture Time/Date: 8:31 PM ,01/13/2022   Delivery Method:Vaginal, Spontaneous  Episiotomy: None  Lacerations:   see labor note Patient had a postpartum course complicated by see .  She is ambulating, tolerating a regular diet, passing flatus, and urinating well. Patient is discharged home in stable condition on 01/15/22.  Newborn Data: Birth date:01/14/2022  Birth time:1:04 AM  Gender:Female  Living status:Living  Apgars:9 ,9  Weight:3430 g   Magnesium Sulfate received: No BMZ received: No Rhophylac:No MMR:No T-DaP:Given postpartum Flu: Yes Transfusion:No  Physical exam  Vitals:   01/14/22 1400 01/14/22 1800 01/14/22 2200 01/15/22 0536  BP: 111/72 131/80 107/79 103/71  Pulse: (!) 103 (!) 109 (!) 121 (!) 102  Resp: _0 Temp: 98.2 F (36.8 C) 98.6 F (37 C) 97.8 F (36.6 C) 98.1 F (36.7 C)  TempSrc: Oral Oral Oral Oral  SpO2: 99% 100% 100% 100%  Weight:      Height:        General: alert and cooperative Lochia: appropriate Uterine Fundus: firm Incision: N/A DVT Evaluation: Negative Homan's sign. Labs: Lab Results  Component Value Date   WBC 12.4 (H) 01/14/2022   HGB 9.8 (L) 01/14/2022   HCT 29.9 (L) 01/14/2022   MCV 80.8 01/14/2022   PLT 263 01/14/2022      Latest Ref Rng & Units 05/19/2021    4:00 PM  CMP  Glucose 70 - 99 mg/dL 83   BUN 6 - 20 mg/dL 7   Creatinine 0.44 - 1.00 mg/dL 0.73   Sodium 135 - 145 mmol/L 137   Potassium 3.5 - 5.1 mmol/L 3.8   Chloride 98 - 111 mmol/L 108   CO2 22 - 32 mmol/L 23   Calcium 8.9 - 10.3 mg/dL 8.5   Total Protein 6.5 - 8.1 g/dL 6.7   Total Bilirubin 0.3 - 1.2 mg/dL 0.5   Alkaline Phos 38 - 126 U/L 59   AST 15 - 41 U/L 21   ALT 0 - 44 U/L 30    Edinburgh Score:    01/14/2022    3:00 AM  Edinburgh Postnatal Depression Scale Screening Tool  I have been able to laugh and see the funny side of things. 0  I have looked forward with enjoyment to things. 0  I have blamed myself unnecessarily  when things went wrong. 0  I have been anxious or worried for no good reason. 2  I have felt scared or panicky for no good reason. 1  Things have been getting on top of me. 1  I have been so unhappy that I have had difficulty sleeping. 0  I have felt sad or miserable. 0  I have been so unhappy that I have been crying. 0  The thought of harming myself has occurred to me. 0  Edinburgh Postnatal Depression Scale Total 4      After visit meds:     Discharge home in stable condition Infant Feeding: Breast Infant Disposition:home with mother Discharge instruction: per After Visit Summary and Postpartum booklet. Activity: Advance as tolerated. Pelvic rest for 6 weeks.  Diet: routine diet Anticipated Birth Control: Nexplanon Postpartum Appointment:6 weeks Additional Postpartum F/U:    Future Appointments:No future appointments. Follow up Visit:  [facility]. Schedule an appointment as soon as possible for a visit.   Specialty: Obstetrics and Gynecology Contact information: [address] [place] [address] [phone]                    01/15/2022 Betsy Coder, MD

## 2022-01-22 ENCOUNTER — Telehealth (HOSPITAL_COMMUNITY): Payer: Self-pay | Admitting: *Deleted

## 2022-01-22 NOTE — Telephone Encounter (Signed)
Left phone voicemail message.  Odis Hollingshead, RN 01-22-2022 at 12:48pm

## 2022-03-25 ENCOUNTER — Other Ambulatory Visit: Payer: Self-pay | Admitting: Obstetrics and Gynecology

## 2022-04-01 ENCOUNTER — Other Ambulatory Visit: Payer: Self-pay

## 2022-04-01 ENCOUNTER — Emergency Department (HOSPITAL_BASED_OUTPATIENT_CLINIC_OR_DEPARTMENT_OTHER): Payer: BC Managed Care – PPO

## 2022-04-01 DIAGNOSIS — R059 Cough, unspecified: Secondary | ICD-10-CM | POA: Diagnosis present

## 2022-04-01 DIAGNOSIS — Z20822 Contact with and (suspected) exposure to covid-19: Secondary | ICD-10-CM | POA: Insufficient documentation

## 2022-04-01 DIAGNOSIS — J101 Influenza due to other identified influenza virus with other respiratory manifestations: Secondary | ICD-10-CM | POA: Insufficient documentation

## 2022-04-01 NOTE — ED Triage Notes (Signed)
Pt reports being diagnosed with the flu on Saturday and reports shortness of breath and cough. Pt reports rib pain with coughing and generalized body aches.

## 2022-04-02 ENCOUNTER — Emergency Department (HOSPITAL_BASED_OUTPATIENT_CLINIC_OR_DEPARTMENT_OTHER)
Admission: EM | Admit: 2022-04-02 | Discharge: 2022-04-02 | Disposition: A | Discharge status: Home / Self Care | Payer: BC Managed Care – PPO | Attending: Emergency Medicine | Admitting: Emergency Medicine

## 2022-04-02 DIAGNOSIS — J101 Influenza due to other identified influenza virus with other respiratory manifestations: Secondary | ICD-10-CM

## 2022-04-02 LAB — RESP PANEL BY RT-PCR (RSV, FLU A&B, COVID)  RVPGX2
Influenza A by PCR: POSITIVE — AB
Influenza B by PCR: NEGATIVE
Resp Syncytial Virus by PCR: NEGATIVE
SARS Coronavirus 2 by RT PCR: NEGATIVE

## 2022-04-02 MED ORDER — IBUPROFEN 400 MG PO TABS
600.0000 mg | ORAL_TABLET | Freq: Once | ORAL | Status: AC
  Administered 2022-04-02: 600 mg via ORAL
  Filled 2022-04-02: qty 1

## 2022-04-02 NOTE — ED Provider Notes (Signed)
[facility] Provider Note   CSN: [id number] Arrival date & time: 04/01/22  2151     History  Chief Complaint  Patient presents with   Shortness of Breath    Monica Tate is a 27 y.o. female.  The history is provided by the patient.  Influenza Presenting symptoms: cough   Presenting symptoms: no fever   Severity:  Moderate Onset quality:  Gradual Duration:  5 days Progression:  Unchanged Chronicity:  New Relieved by:  Nothing Worsened by:  Nothing Ineffective treatments:  None tried Risk factors: not elderly   Patient diagnosed with flu A and continues to have cough and chest congestion and was concerned for PNA.       Home Medications Prior to Admission medications   Medication Sig Start Date End Date Taking? Authorizing Provider  acetaminophen (TYLENOL) 325 MG tablet Take 2 tablets (650 mg total) by mouth every 4 (four) hours as needed (for pain scale < 4). 01/15/22   Dillard, Gwynneth Munson, MD  benzocaine-Menthol (DERMOPLAST) 20-0.5 % AERO Apply 1 Application topically as needed for irritation (perineal discomfort). 01/15/22   Crawford Givens, MD  ibuprofen (ADVIL) 600 MG tablet Take 1 tablet (600 mg total) by mouth every 6 (six) hours. 01/15/22   Crawford Givens, MD  iron polysaccharides (NIFEREX) 150 MG capsule Take 1 capsule (150 mg total) by mouth daily. 01/16/22   Crawford Givens, MD  Prenatal Vit-Fe Fumarate-FA (PRENATAL MULTIVITAMIN) TABS tablet Take 1 tablet by mouth daily at 12 noon. 01/16/22   Crawford Givens, MD      Allergies    Patient has no known allergies.    Review of Systems   Review of Systems  Constitutional:  Negative for fever.  HENT:  Negative for facial swelling.   Eyes:  Negative for redness.  Respiratory:  Positive for cough. Negative for wheezing and stridor.   Cardiovascular:  Negative for palpitations and leg swelling.  All other systems reviewed and are negative.   Physical Exam Updated  Vital Signs BP 114/89 (BP Location: Right Arm)   Pulse 77   Temp 98.4 F (36.9 C) (Oral)   Resp 18   Ht '5\' 7"'$  (1.702 m)   Wt 127 kg   LMP 03/11/2022 (Exact Date)   SpO2 100%   BMI 43.85 kg/m  Physical Exam Vitals and nursing note reviewed. Exam conducted with a chaperone present.  Constitutional:      General: She is not in acute distress.    Appearance: Normal appearance. She is well-developed.  HENT:     Head: Normocephalic and atraumatic.     Nose: Nose normal.  Eyes:     Pupils: Pupils are equal, round, and reactive to light.  Cardiovascular:     Rate and Rhythm: Normal rate and regular rhythm.     Pulses: Normal pulses.     Heart sounds: Normal heart sounds.  Pulmonary:     Effort: Pulmonary effort is normal. No respiratory distress.     Breath sounds: Normal breath sounds.  Abdominal:     General: Abdomen is flat. Bowel sounds are normal. There is no distension.     Palpations: Abdomen is soft.     Tenderness: There is no abdominal tenderness. There is no guarding or rebound.  Genitourinary:    Vagina: No vaginal discharge.  Musculoskeletal:        General: No tenderness. Normal range of motion.     Cervical back: Neck supple.  Right lower leg: No edema.     Left lower leg: No edema.  Skin:    General: Skin is warm and dry.     Capillary Refill: Capillary refill takes less than 2 seconds.     Findings: No erythema or rash.  Neurological:     General: No focal deficit present.     Mental Status: She is alert.     Deep Tendon Reflexes: Reflexes normal.  Psychiatric:        Mood and Affect: Mood normal.     ED Results / Procedures / Treatments   Labs (all labs ordered are listed, but only abnormal results are displayed) Labs Reviewed  RESP PANEL BY RT-PCR (RSV, FLU A&B, COVID)  RVPGX2 - Abnormal; Notable for the following components:      Result Value   Influenza A by PCR POSITIVE (*)    All other components within normal limits     EKG None  Radiology DG Chest 2 View  Result Date: 04/01/2022 CLINICAL DATA:  Shortness of breath EXAM: CHEST - 2 VIEW COMPARISON:  Chest x-ray 11/21/2015 FINDINGS: The heart size and mediastinal contours are within normal limits. Both lungs are clear. The visualized skeletal structures are unremarkable. IMPRESSION: No active cardiopulmonary disease. Electronically Signed   By: Ronney Asters M.D.   On: 04/01/2022 22:41    Procedures Procedures    Medications Ordered in ED Medications  ibuprofen (ADVIL) tablet 600 mg (600 mg Oral Given 04/02/22 [id number])    ED Course/ Medical Decision Making/ A&P                             Medical Decision Making Flu with continued symptoms   Amount and/or Complexity of Data Reviewed External Data Reviewed: notes.    Details: Previous notes reviewed  Labs: ordered.    Details: Covid is negative Flu A positive  Radiology: ordered and independent interpretation performed.    Details: Negative CXR by me   Risk Risk Details: Well appearing with normal exam and vital signs, CXR is negative for PNA.  Normal oxygenation.  Stable for discharge with close follow up.  Symptoms are consistent with Influenza A.  FOllow up with your PMD.  Strict return.     Final Clinical Impression(s) / ED Diagnoses Final diagnoses:  Influenza A   Return for intractable cough, coughing up blood, fevers > 100.4 unrelieved by medication, shortness of breath, intractable vomiting, chest pain, shortness of breath, weakness, numbness, changes in speech, facial asymmetry, abdominal pain, passing out, Inability to tolerate liquids or food, cough, altered mental status or any concerns. No signs of systemic illness or infection. The patient is nontoxic-appearing on exam and vital signs are within normal limits.  I have reviewed the triage vital signs and the nursing notes. Pertinent labs & imaging results that were available during my care of the patient were reviewed by me and  considered in my medical decision making (see chart for details). After history, exam, and medical workup I feel the patient has been appropriately medically screened and is safe for discharge home. Pertinent diagnoses were discussed with the patient. Patient was given return precautions. Rx / DC Orders ED Discharge Orders     None         Teniola Tseng, MD 04/02/22 [phone]

## 2022-08-13 IMAGING — CT CT ANGIO CHEST
2 of 8 series · 19 of 36 positions shown · IV contrast (Omnipaque)
Comparison: None.

CLINICAL DATA: Pulmonary embolism (PE) suspected, positive D-dimer

Intermittent but progressive shortness of breath.
EXAM:
CT ANGIOGRAPHY CHEST WITH CONTRAST
TECHNIQUE: Multidetector CT imaging of the chest was performed using the
standard protocol during bolus administration of intravenous
contrast. Multiplanar CT image reconstructions and MIPs were
obtained to evaluate the vascular anatomy.

[Series 5: pe thins · axial · 0.67mm/px · z∈[-298,-29]mm · 18 of 301 slices shown]
[im 16/301  lung]
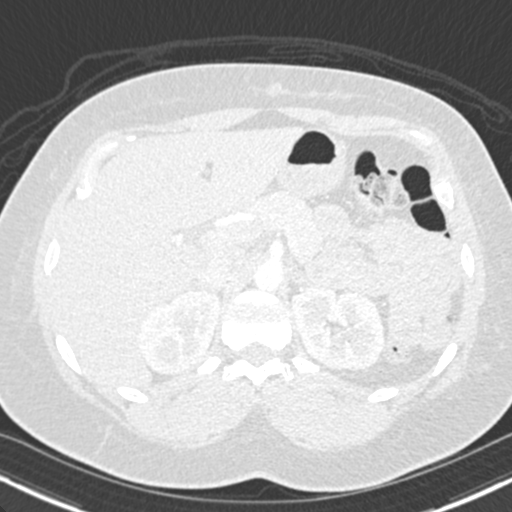
[im 32/301  mediastinal]
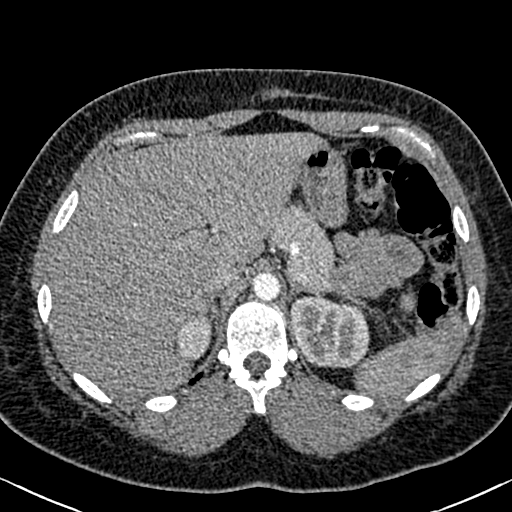
[im 48/301  lung]
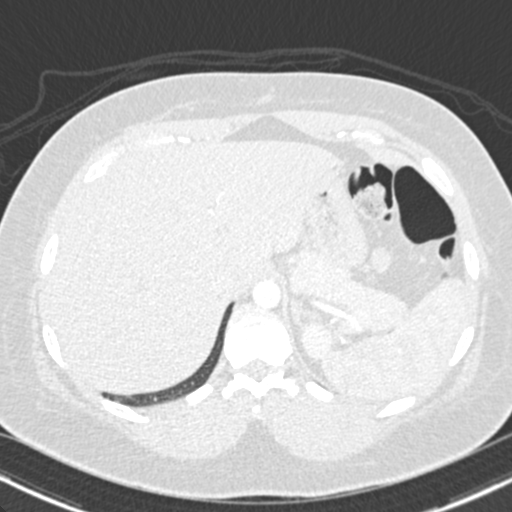
[im 64/301  mediastinal]
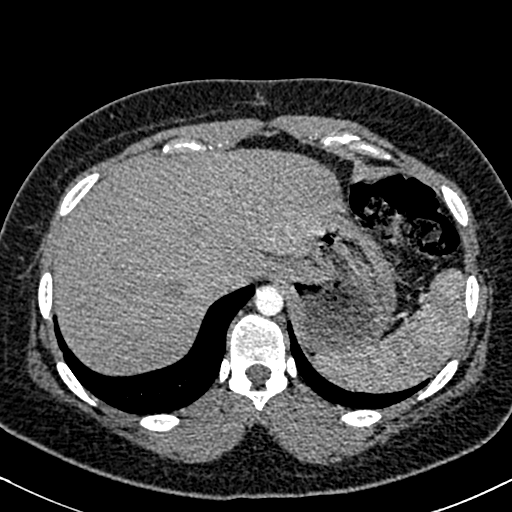
[im 79/301  lung]
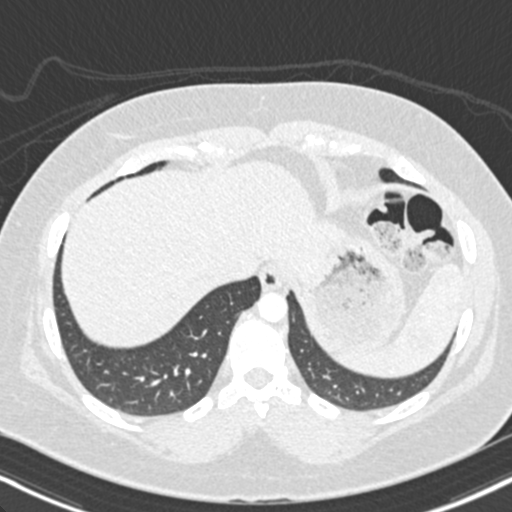
[im 95/301  mediastinal]
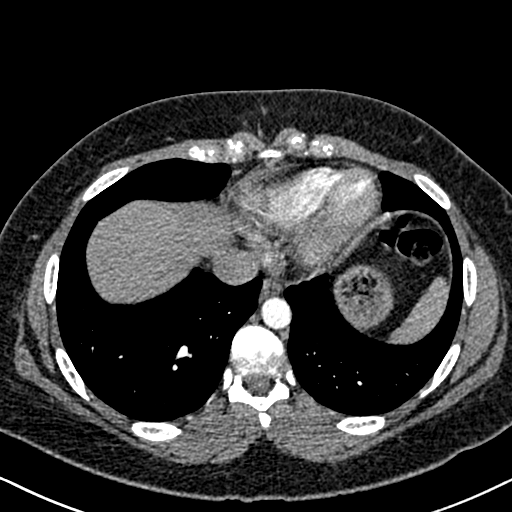
[im 111/301  lung]
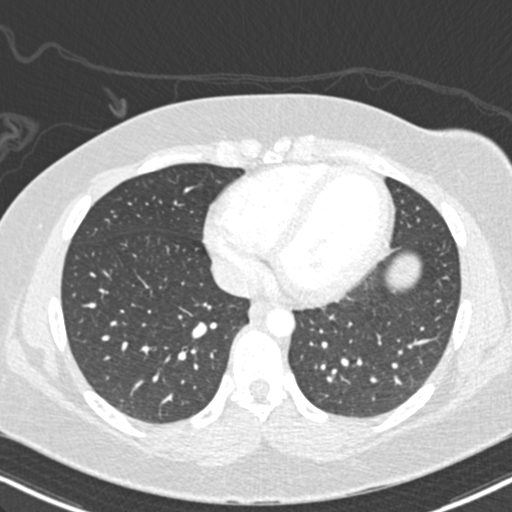
[im 127/301  mediastinal]
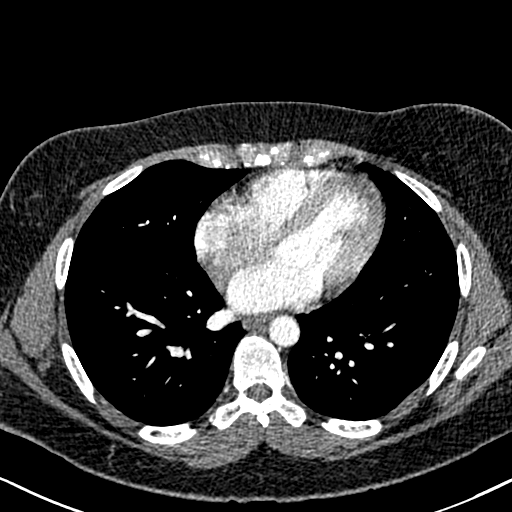
[im 143/301  lung]
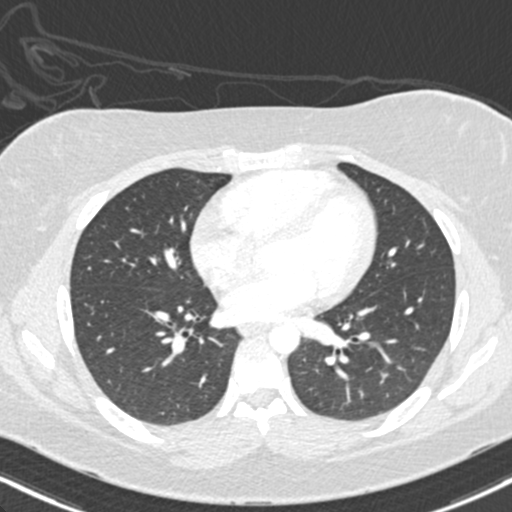
[im 158/301  mediastinal]
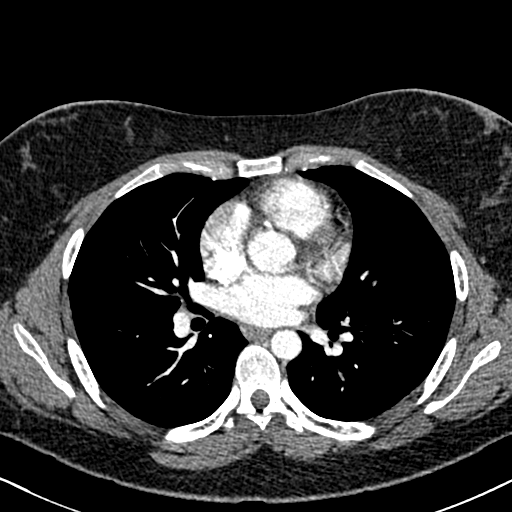
[im 174/301  lung]
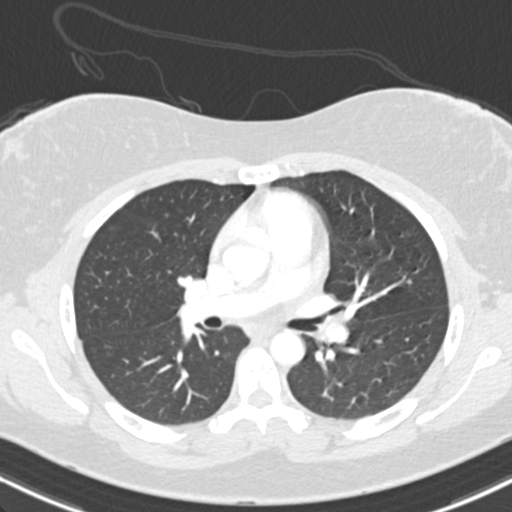
[im 190/301  mediastinal]
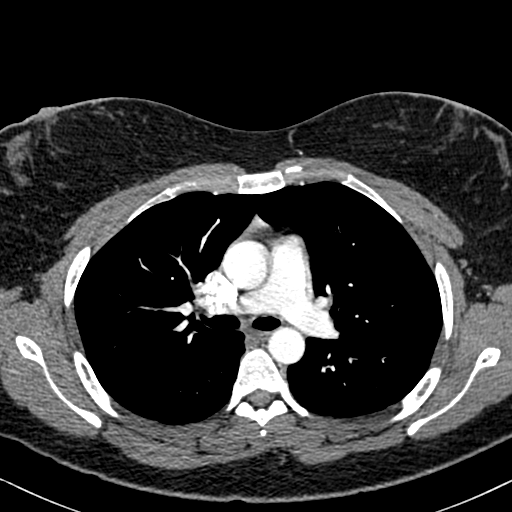
[im 206/301  lung]
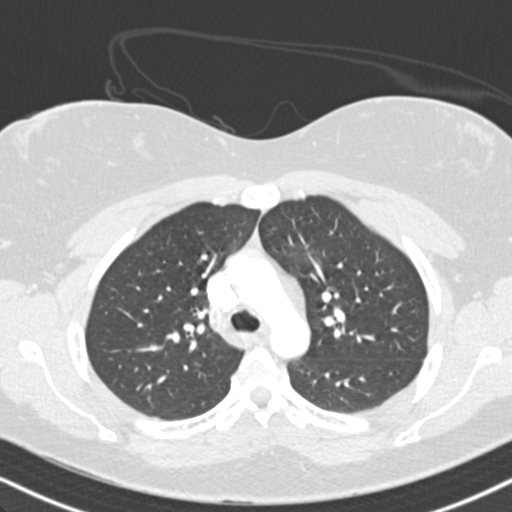
[im 222/301  mediastinal]
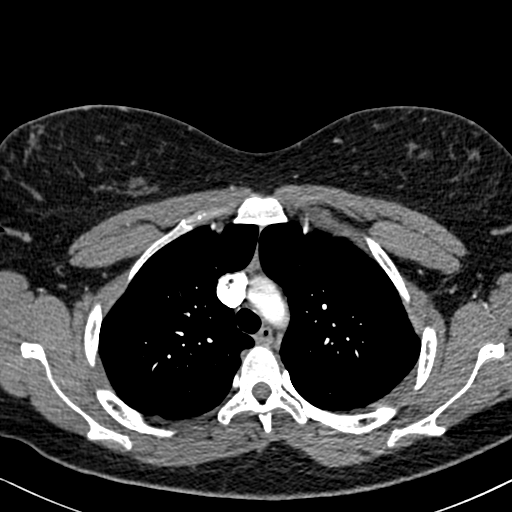
[im 237/301  lung]
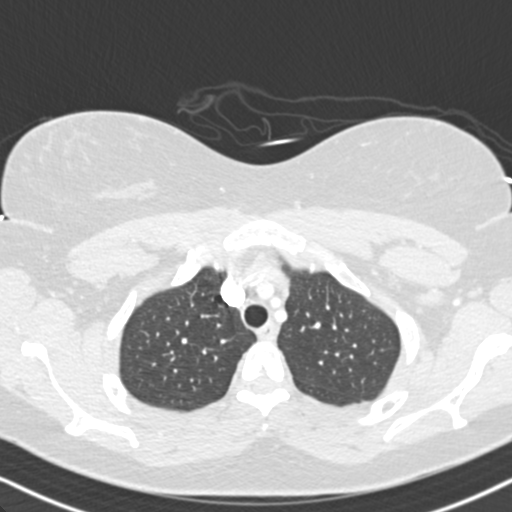
[im 253/301  mediastinal]
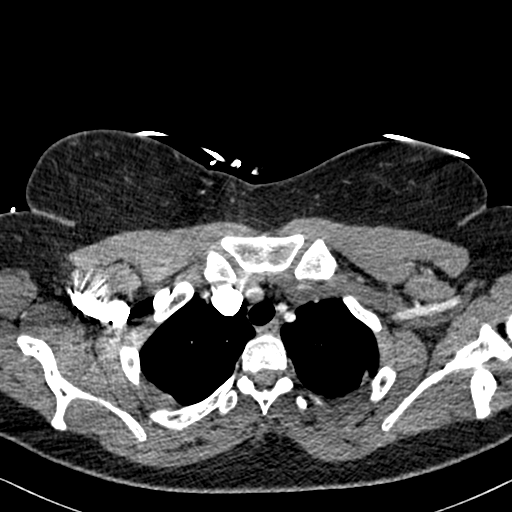
[im 269/301  lung]
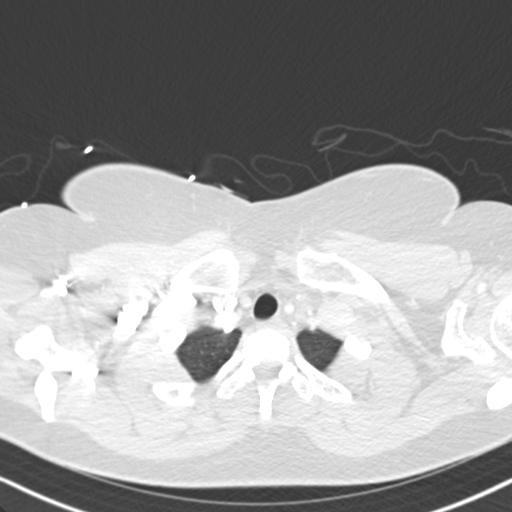
[im 285/301  mediastinal]
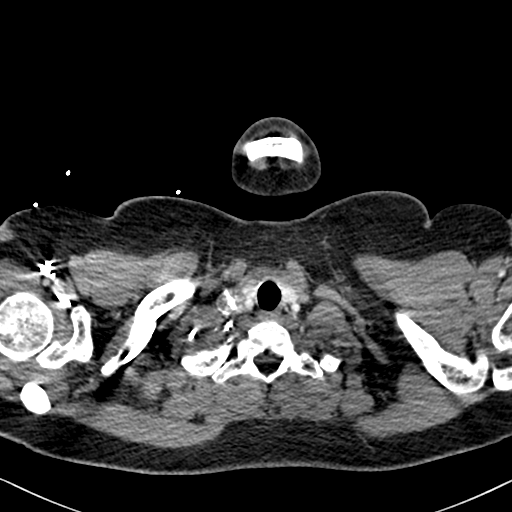

[Series 7: pe coronal mpr · coronal · 0.62mm/px · 1 of 151 slices shown]
[im 76/151  mediastinal]
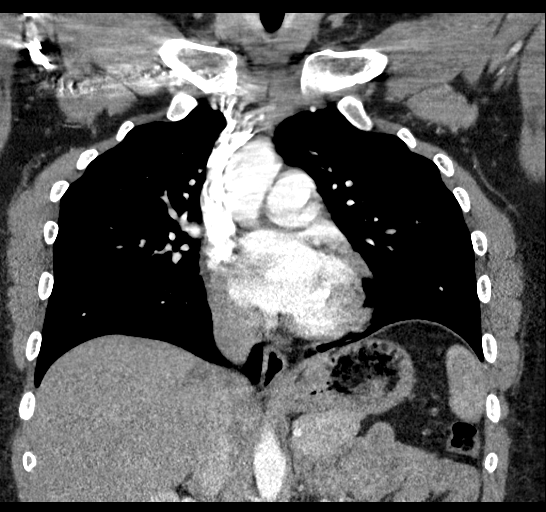

[19 of 36 positions shown; findings below may reference images not displayed]

RADIATION DOSE REDUCTION: This exam was performed according to the
departmental dose-optimization program which includes automated
exposure control, adjustment of the mA and/or kV according to
patient size and/or use of iterative reconstruction technique.

CONTRAST:  75mL OMNIPAQUE IOHEXOL 350 MG/ML SOLN
FINDINGS: Cardiovascular: There are no filling defects within the pulmonary
arteries to suggest pulmonary embolus. The subsegmental branches are
not well assessed due to contrast bolus timing. Thoracic aorta is
normal in caliber. No dissection or acute aortic findings
conventional branching pattern from the aortic arch. Heart is normal
in size without pericardial effusion.

Mediastinum/Nodes: No adenopathy or mediastinal mass. Decompressed
esophagus. No thyroid nodule.

Lungs/Pleura: Clear lungs. No focal airspace disease. No pleural
fluid. No pulmonary nodule or mass.

Upper Abdomen: No acute or unexpected findings.

Musculoskeletal: There are no acute or suspicious osseous
abnormalities.

Review of the MIP images confirms the above findings.
IMPRESSION: No pulmonary embolus or acute intrathoracic abnormality.

## 2022-08-15 IMAGING — US US OB < 14 WEEKS - US OB TV
1 series · 14 of 28 positions shown · non-contrast
Comparison: None.

CLINICAL DATA: Pelvic pain.

EXAM:
OBSTETRIC <14 WK US AND TRANSVAGINAL OB US
TECHNIQUE: Both transabdominal and transvaginal ultrasound examinations were
performed for complete evaluation of the gestation as well as the
maternal uterus, adnexal regions, and pelvic cul-de-sac.
Transvaginal technique was performed to assess early pregnancy.

[Series 1: us ob < 14 weeks - us ob tv · 14 of 77 slices shown]
[im 3/77]
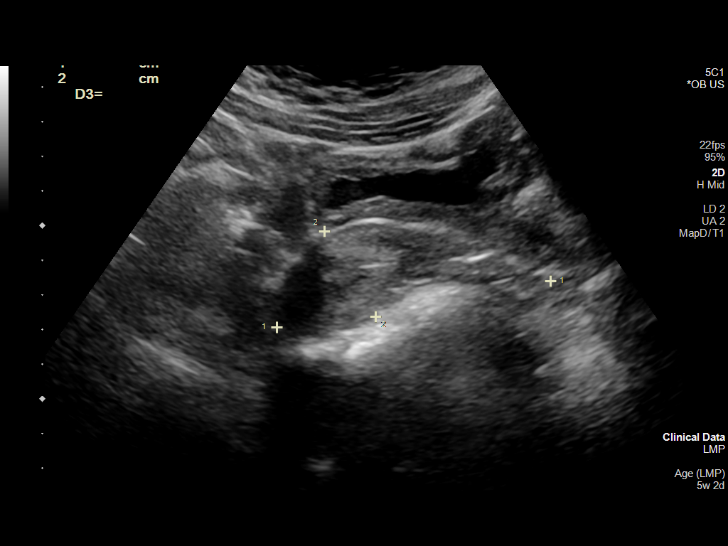
[im 9/77]
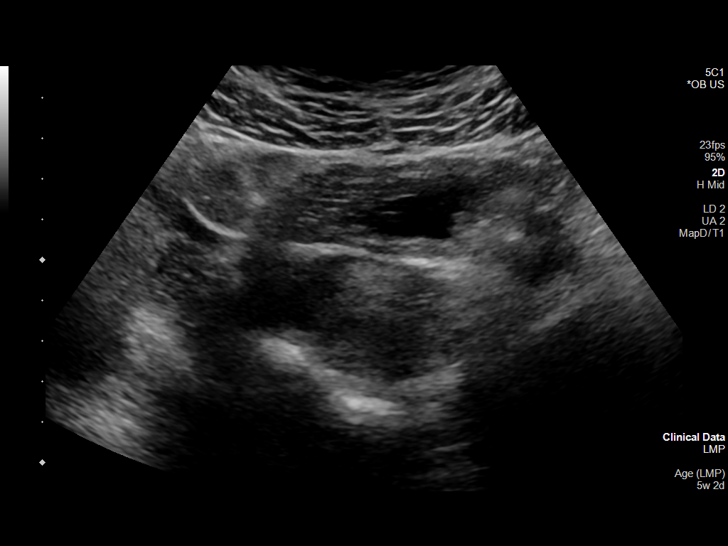
[im 15/77]
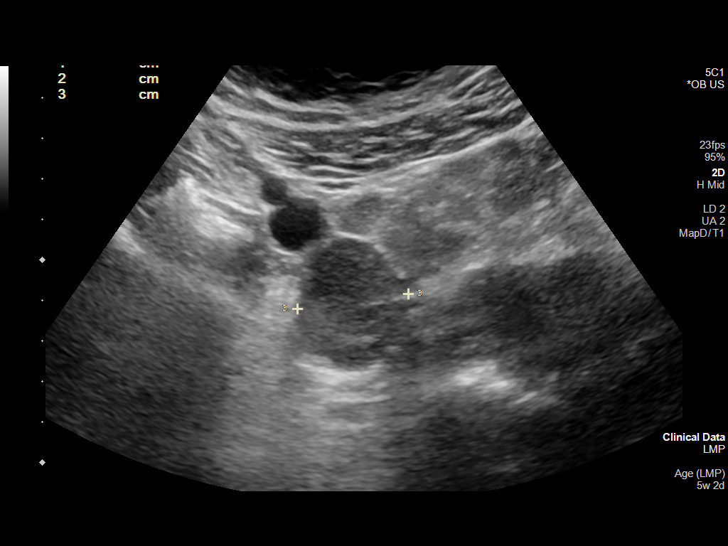
[im 20/77]
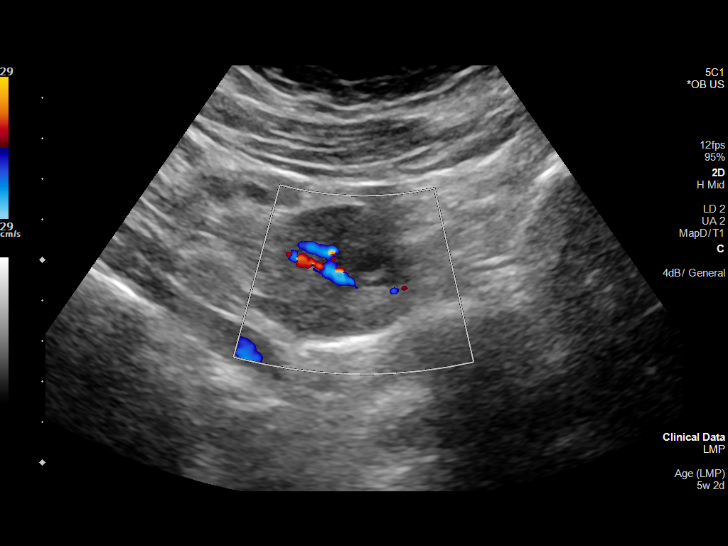
[im 26/77]
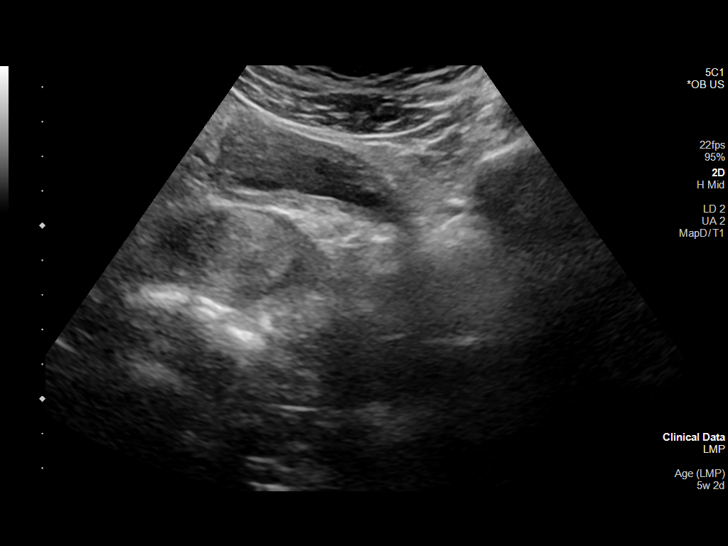
[im 31/77]
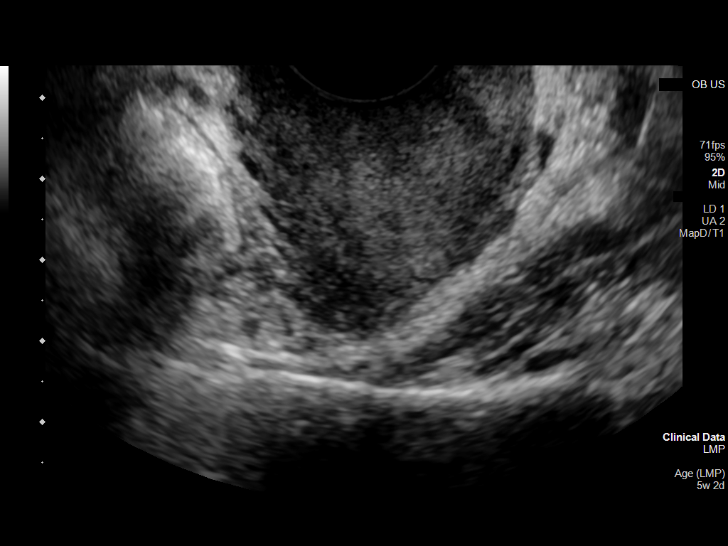
[im 37/77]
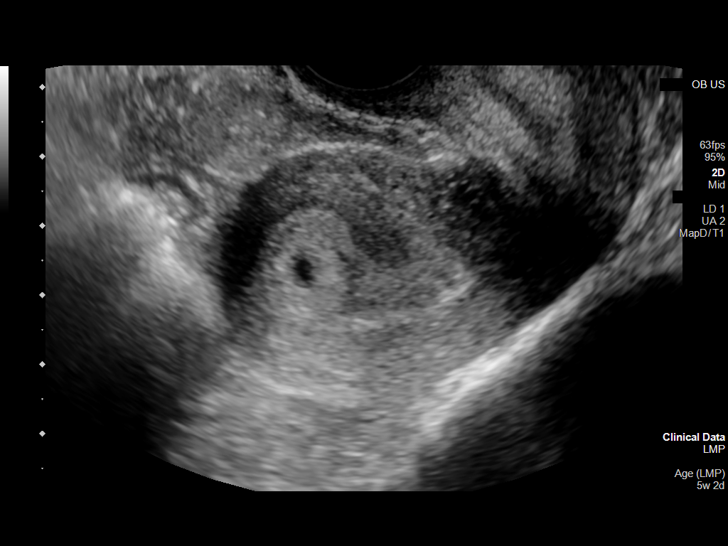
[im 43/77]
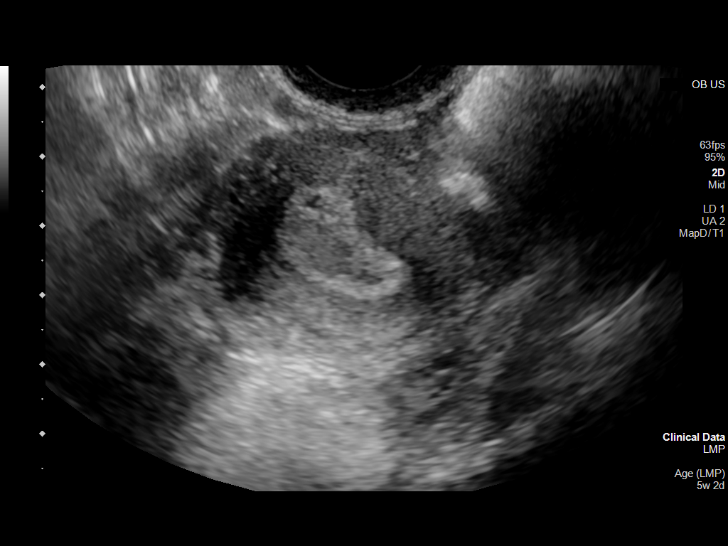
[im 48/77]
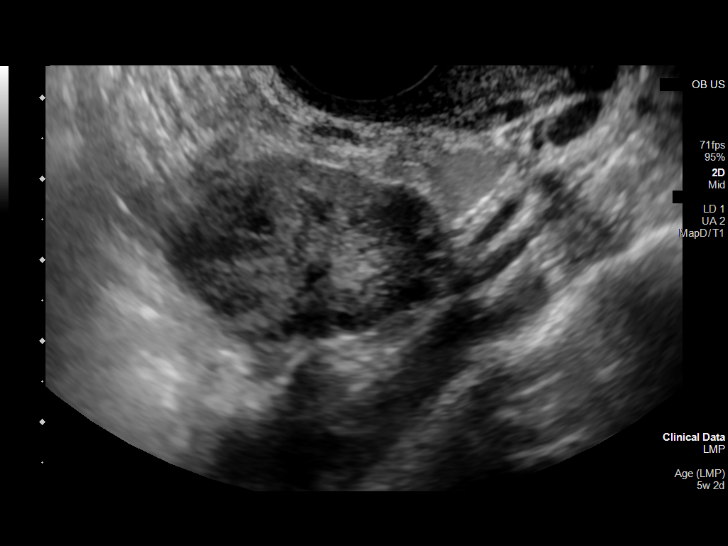
[im 54/77]
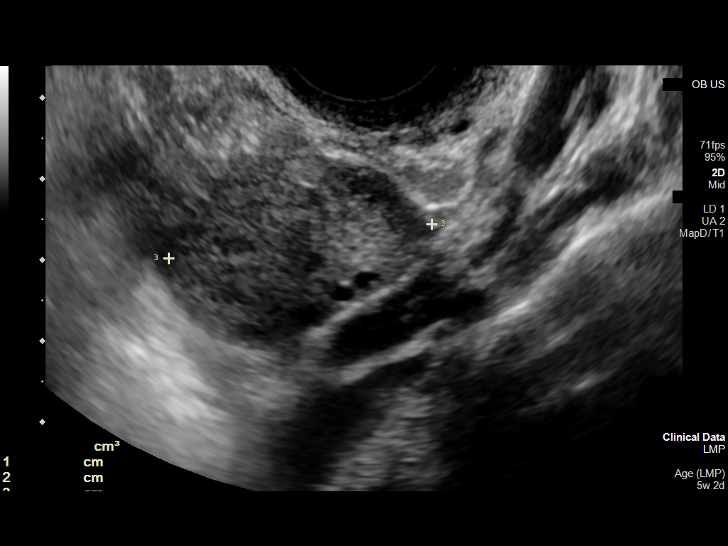
[im 60/77]
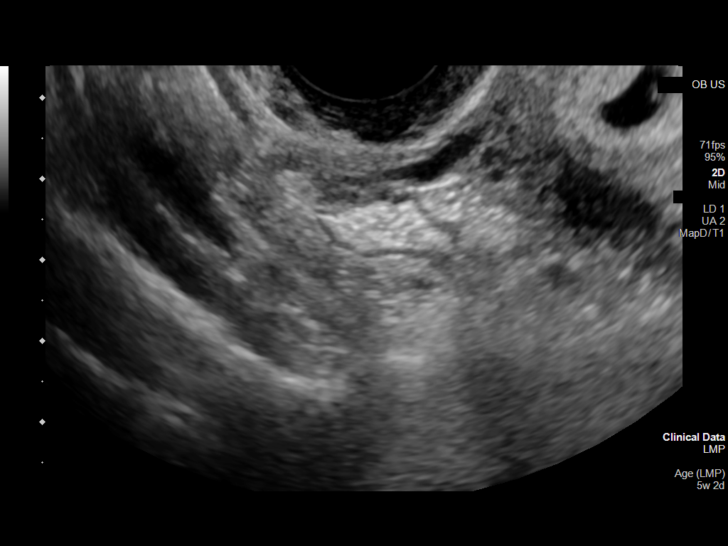
[im 65/77]
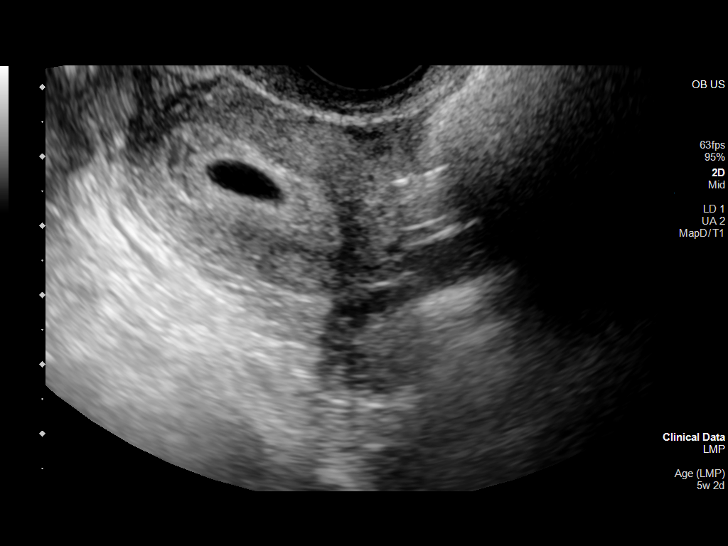
[im 71/77]
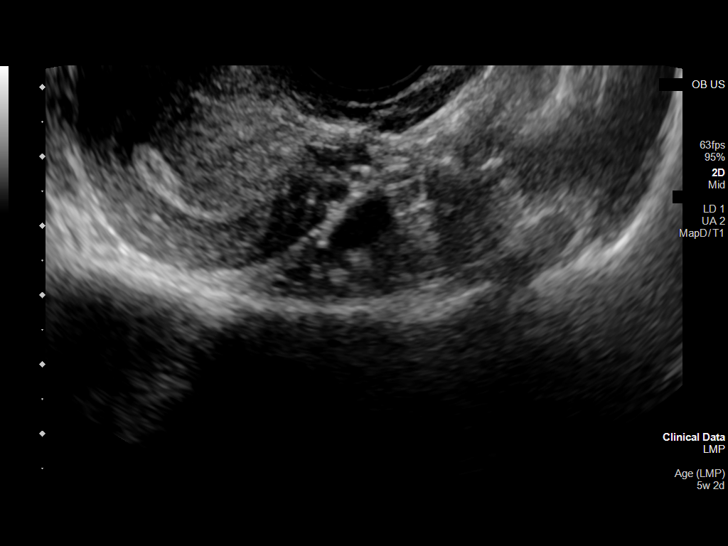
[im 77/77]
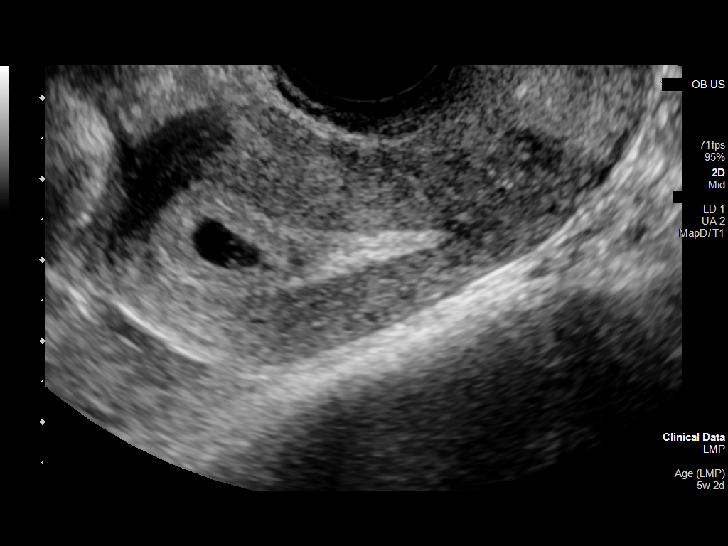

[14 of 28 positions shown; findings below may reference images not displayed]

FINDINGS: Intrauterine gestational sac: Single

Yolk sac:  Visualized.

Embryo:  Visualized.

Cardiac Activity: Not Visualized.

CRL:  2 mm   5 w   5 d                  US EDC: 01/14/2022

Subchorionic hemorrhage:  None visualized.

Maternal uterus/adnexae: Corpus luteum noted in the right ovary.
Left ovary is within normal limits. Trace free fluid in the pelvis.
IMPRESSION: 1. Single intrauterine gestation identified. No cardiac activity
identified at this time which may still be within normal limits for
a gestation this age. Recommend short-term follow-up ultrasound
confirm viability.
2. Likely corpus luteum in the right ovary.
3. Trace free fluid in the pelvis.
